# Patient Record
Sex: Male | Born: 1940 | Race: Black or African American | Hispanic: No | Marital: Married | Smoking: Former smoker
Health system: Southern US, Community
[De-identification: ages and names within clinical notes are randomized; demographics above are authoritative.]

---

## 2016-03-09 DIAGNOSIS — J449 Chronic obstructive pulmonary disease, unspecified: Secondary | ICD-10-CM | POA: Diagnosis not present

## 2016-04-08 DIAGNOSIS — J449 Chronic obstructive pulmonary disease, unspecified: Secondary | ICD-10-CM | POA: Diagnosis not present

## 2016-04-15 DIAGNOSIS — J449 Chronic obstructive pulmonary disease, unspecified: Secondary | ICD-10-CM | POA: Diagnosis not present

## 2016-04-17 DIAGNOSIS — I10 Essential (primary) hypertension: Secondary | ICD-10-CM | POA: Diagnosis not present

## 2016-05-09 DIAGNOSIS — J449 Chronic obstructive pulmonary disease, unspecified: Secondary | ICD-10-CM | POA: Diagnosis not present

## 2016-05-21 DIAGNOSIS — I10 Essential (primary) hypertension: Secondary | ICD-10-CM | POA: Diagnosis not present

## 2016-06-09 DIAGNOSIS — J449 Chronic obstructive pulmonary disease, unspecified: Secondary | ICD-10-CM | POA: Diagnosis not present

## 2016-07-09 DIAGNOSIS — J449 Chronic obstructive pulmonary disease, unspecified: Secondary | ICD-10-CM | POA: Diagnosis not present

## 2016-07-23 DIAGNOSIS — H353132 Nonexudative age-related macular degeneration, bilateral, intermediate dry stage: Secondary | ICD-10-CM | POA: Diagnosis not present

## 2016-08-09 DIAGNOSIS — J449 Chronic obstructive pulmonary disease, unspecified: Secondary | ICD-10-CM | POA: Diagnosis not present

## 2016-08-19 DIAGNOSIS — Z23 Encounter for immunization: Secondary | ICD-10-CM | POA: Diagnosis not present

## 2016-08-19 DIAGNOSIS — J449 Chronic obstructive pulmonary disease, unspecified: Secondary | ICD-10-CM | POA: Diagnosis not present

## 2016-08-21 DIAGNOSIS — J449 Chronic obstructive pulmonary disease, unspecified: Secondary | ICD-10-CM | POA: Diagnosis not present

## 2016-08-27 DIAGNOSIS — Z86010 Personal history of colonic polyps: Secondary | ICD-10-CM | POA: Diagnosis not present

## 2016-09-08 DIAGNOSIS — J449 Chronic obstructive pulmonary disease, unspecified: Secondary | ICD-10-CM | POA: Diagnosis not present

## 2016-10-02 DIAGNOSIS — Z23 Encounter for immunization: Secondary | ICD-10-CM | POA: Diagnosis not present

## 2016-10-02 DIAGNOSIS — R221 Localized swelling, mass and lump, neck: Secondary | ICD-10-CM | POA: Diagnosis not present

## 2016-10-02 DIAGNOSIS — Z13220 Encounter for screening for lipoid disorders: Secondary | ICD-10-CM | POA: Diagnosis not present

## 2016-10-02 DIAGNOSIS — I10 Essential (primary) hypertension: Secondary | ICD-10-CM | POA: Diagnosis not present

## 2016-10-09 DIAGNOSIS — J449 Chronic obstructive pulmonary disease, unspecified: Secondary | ICD-10-CM | POA: Diagnosis not present

## 2016-11-09 DIAGNOSIS — J449 Chronic obstructive pulmonary disease, unspecified: Secondary | ICD-10-CM | POA: Diagnosis not present

## 2016-11-25 DIAGNOSIS — J449 Chronic obstructive pulmonary disease, unspecified: Secondary | ICD-10-CM | POA: Diagnosis not present

## 2016-12-07 DIAGNOSIS — J449 Chronic obstructive pulmonary disease, unspecified: Secondary | ICD-10-CM | POA: Diagnosis not present

## 2017-01-07 DIAGNOSIS — J449 Chronic obstructive pulmonary disease, unspecified: Secondary | ICD-10-CM | POA: Diagnosis not present

## 2017-02-03 DIAGNOSIS — J449 Chronic obstructive pulmonary disease, unspecified: Secondary | ICD-10-CM | POA: Diagnosis not present

## 2017-02-06 DIAGNOSIS — J449 Chronic obstructive pulmonary disease, unspecified: Secondary | ICD-10-CM | POA: Diagnosis not present

## 2017-03-09 DIAGNOSIS — J449 Chronic obstructive pulmonary disease, unspecified: Secondary | ICD-10-CM | POA: Diagnosis not present

## 2017-04-08 DIAGNOSIS — J449 Chronic obstructive pulmonary disease, unspecified: Secondary | ICD-10-CM | POA: Diagnosis not present

## 2017-05-05 DIAGNOSIS — J449 Chronic obstructive pulmonary disease, unspecified: Secondary | ICD-10-CM | POA: Diagnosis not present

## 2017-05-09 DIAGNOSIS — J449 Chronic obstructive pulmonary disease, unspecified: Secondary | ICD-10-CM | POA: Diagnosis not present

## 2017-05-20 DIAGNOSIS — Z125 Encounter for screening for malignant neoplasm of prostate: Secondary | ICD-10-CM | POA: Diagnosis not present

## 2017-05-20 DIAGNOSIS — I10 Essential (primary) hypertension: Secondary | ICD-10-CM | POA: Diagnosis not present

## 2017-05-20 DIAGNOSIS — Z1389 Encounter for screening for other disorder: Secondary | ICD-10-CM | POA: Diagnosis not present

## 2017-05-20 DIAGNOSIS — R609 Edema, unspecified: Secondary | ICD-10-CM | POA: Diagnosis not present

## 2017-05-20 DIAGNOSIS — R05 Cough: Secondary | ICD-10-CM | POA: Diagnosis not present

## 2017-05-20 DIAGNOSIS — Z13220 Encounter for screening for lipoid disorders: Secondary | ICD-10-CM | POA: Diagnosis not present

## 2017-05-20 DIAGNOSIS — Z136 Encounter for screening for cardiovascular disorders: Secondary | ICD-10-CM | POA: Diagnosis not present

## 2017-05-20 DIAGNOSIS — R079 Chest pain, unspecified: Secondary | ICD-10-CM | POA: Diagnosis not present

## 2017-06-09 DIAGNOSIS — J449 Chronic obstructive pulmonary disease, unspecified: Secondary | ICD-10-CM | POA: Diagnosis not present

## 2017-07-09 DIAGNOSIS — J449 Chronic obstructive pulmonary disease, unspecified: Secondary | ICD-10-CM | POA: Diagnosis not present

## 2017-08-04 DIAGNOSIS — J449 Chronic obstructive pulmonary disease, unspecified: Secondary | ICD-10-CM | POA: Diagnosis not present

## 2017-08-07 DIAGNOSIS — H353132 Nonexudative age-related macular degeneration, bilateral, intermediate dry stage: Secondary | ICD-10-CM | POA: Diagnosis not present

## 2017-08-09 DIAGNOSIS — J449 Chronic obstructive pulmonary disease, unspecified: Secondary | ICD-10-CM | POA: Diagnosis not present

## 2017-09-08 DIAGNOSIS — J449 Chronic obstructive pulmonary disease, unspecified: Secondary | ICD-10-CM | POA: Diagnosis not present

## 2017-09-18 DIAGNOSIS — H47012 Ischemic optic neuropathy, left eye: Secondary | ICD-10-CM | POA: Diagnosis not present

## 2017-10-09 DIAGNOSIS — J449 Chronic obstructive pulmonary disease, unspecified: Secondary | ICD-10-CM | POA: Diagnosis not present

## 2017-11-09 DIAGNOSIS — J449 Chronic obstructive pulmonary disease, unspecified: Secondary | ICD-10-CM | POA: Diagnosis not present

## 2017-11-10 DIAGNOSIS — J449 Chronic obstructive pulmonary disease, unspecified: Secondary | ICD-10-CM | POA: Diagnosis not present

## 2017-11-17 DIAGNOSIS — Z9981 Dependence on supplemental oxygen: Secondary | ICD-10-CM | POA: Diagnosis not present

## 2017-11-17 DIAGNOSIS — Z452 Encounter for adjustment and management of vascular access device: Secondary | ICD-10-CM | POA: Diagnosis not present

## 2017-11-17 DIAGNOSIS — Z79899 Other long term (current) drug therapy: Secondary | ICD-10-CM | POA: Diagnosis not present

## 2017-11-17 DIAGNOSIS — Z7982 Long term (current) use of aspirin: Secondary | ICD-10-CM | POA: Diagnosis not present

## 2017-11-17 DIAGNOSIS — K3189 Other diseases of stomach and duodenum: Secondary | ICD-10-CM | POA: Diagnosis not present

## 2017-11-17 DIAGNOSIS — J9690 Respiratory failure, unspecified, unspecified whether with hypoxia or hypercapnia: Secondary | ICD-10-CM | POA: Diagnosis not present

## 2017-11-17 DIAGNOSIS — K921 Melena: Secondary | ICD-10-CM | POA: Diagnosis not present

## 2017-11-17 DIAGNOSIS — R197 Diarrhea, unspecified: Secondary | ICD-10-CM | POA: Diagnosis not present

## 2017-11-17 DIAGNOSIS — K259 Gastric ulcer, unspecified as acute or chronic, without hemorrhage or perforation: Secondary | ICD-10-CM | POA: Diagnosis not present

## 2017-11-17 DIAGNOSIS — J9621 Acute and chronic respiratory failure with hypoxia: Secondary | ICD-10-CM | POA: Diagnosis not present

## 2017-11-17 DIAGNOSIS — B370 Candidal stomatitis: Secondary | ICD-10-CM | POA: Diagnosis not present

## 2017-11-17 DIAGNOSIS — K222 Esophageal obstruction: Secondary | ICD-10-CM | POA: Diagnosis not present

## 2017-11-17 DIAGNOSIS — D509 Iron deficiency anemia, unspecified: Secondary | ICD-10-CM | POA: Diagnosis not present

## 2017-11-17 DIAGNOSIS — K648 Other hemorrhoids: Secondary | ICD-10-CM | POA: Diagnosis not present

## 2017-11-17 DIAGNOSIS — K2210 Ulcer of esophagus without bleeding: Secondary | ICD-10-CM | POA: Diagnosis not present

## 2017-11-17 DIAGNOSIS — K264 Chronic or unspecified duodenal ulcer with hemorrhage: Secondary | ICD-10-CM | POA: Diagnosis not present

## 2017-11-17 DIAGNOSIS — D126 Benign neoplasm of colon, unspecified: Secondary | ICD-10-CM | POA: Diagnosis not present

## 2017-11-17 DIAGNOSIS — Z87891 Personal history of nicotine dependence: Secondary | ICD-10-CM | POA: Diagnosis not present

## 2017-11-17 DIAGNOSIS — D122 Benign neoplasm of ascending colon: Secondary | ICD-10-CM | POA: Diagnosis not present

## 2017-11-17 DIAGNOSIS — J441 Chronic obstructive pulmonary disease with (acute) exacerbation: Secondary | ICD-10-CM | POA: Diagnosis not present

## 2017-11-17 DIAGNOSIS — K449 Diaphragmatic hernia without obstruction or gangrene: Secondary | ICD-10-CM | POA: Diagnosis not present

## 2017-11-17 DIAGNOSIS — K262 Acute duodenal ulcer with both hemorrhage and perforation: Secondary | ICD-10-CM | POA: Diagnosis not present

## 2017-11-17 DIAGNOSIS — J439 Emphysema, unspecified: Secondary | ICD-10-CM | POA: Diagnosis not present

## 2017-11-17 DIAGNOSIS — K5520 Angiodysplasia of colon without hemorrhage: Secondary | ICD-10-CM | POA: Diagnosis not present

## 2017-11-17 DIAGNOSIS — I10 Essential (primary) hypertension: Secondary | ICD-10-CM | POA: Diagnosis not present

## 2017-11-17 DIAGNOSIS — J449 Chronic obstructive pulmonary disease, unspecified: Secondary | ICD-10-CM | POA: Diagnosis not present

## 2017-11-17 DIAGNOSIS — R0602 Shortness of breath: Secondary | ICD-10-CM | POA: Diagnosis not present

## 2017-11-17 DIAGNOSIS — Z86010 Personal history of colonic polyps: Secondary | ICD-10-CM | POA: Diagnosis not present

## 2017-11-17 DIAGNOSIS — D649 Anemia, unspecified: Secondary | ICD-10-CM | POA: Diagnosis not present

## 2017-11-17 DIAGNOSIS — D62 Acute posthemorrhagic anemia: Secondary | ICD-10-CM | POA: Diagnosis not present

## 2017-11-17 DIAGNOSIS — K635 Polyp of colon: Secondary | ICD-10-CM | POA: Diagnosis not present

## 2017-11-17 DIAGNOSIS — Z9989 Dependence on other enabling machines and devices: Secondary | ICD-10-CM | POA: Diagnosis not present

## 2017-11-18 DIAGNOSIS — R0602 Shortness of breath: Secondary | ICD-10-CM | POA: Diagnosis not present

## 2017-12-03 DIAGNOSIS — H11002 Unspecified pterygium of left eye: Secondary | ICD-10-CM | POA: Diagnosis not present

## 2017-12-03 DIAGNOSIS — H401111 Primary open-angle glaucoma, right eye, mild stage: Secondary | ICD-10-CM | POA: Diagnosis not present

## 2017-12-03 DIAGNOSIS — H47012 Ischemic optic neuropathy, left eye: Secondary | ICD-10-CM | POA: Diagnosis not present

## 2017-12-03 DIAGNOSIS — H2512 Age-related nuclear cataract, left eye: Secondary | ICD-10-CM | POA: Diagnosis not present

## 2017-12-07 DIAGNOSIS — J449 Chronic obstructive pulmonary disease, unspecified: Secondary | ICD-10-CM | POA: Diagnosis not present

## 2018-01-07 DIAGNOSIS — J449 Chronic obstructive pulmonary disease, unspecified: Secondary | ICD-10-CM | POA: Diagnosis not present

## 2018-01-07 DIAGNOSIS — Z13220 Encounter for screening for lipoid disorders: Secondary | ICD-10-CM | POA: Diagnosis not present

## 2018-01-07 DIAGNOSIS — I10 Essential (primary) hypertension: Secondary | ICD-10-CM | POA: Diagnosis not present

## 2018-01-25 DIAGNOSIS — R0602 Shortness of breath: Secondary | ICD-10-CM | POA: Diagnosis not present

## 2018-01-25 DIAGNOSIS — J449 Chronic obstructive pulmonary disease, unspecified: Secondary | ICD-10-CM | POA: Diagnosis not present

## 2018-01-25 DIAGNOSIS — Z79899 Other long term (current) drug therapy: Secondary | ICD-10-CM | POA: Diagnosis not present

## 2018-01-25 DIAGNOSIS — M1990 Unspecified osteoarthritis, unspecified site: Secondary | ICD-10-CM | POA: Diagnosis not present

## 2018-01-25 DIAGNOSIS — I509 Heart failure, unspecified: Secondary | ICD-10-CM | POA: Diagnosis not present

## 2018-01-25 DIAGNOSIS — Z87891 Personal history of nicotine dependence: Secondary | ICD-10-CM | POA: Diagnosis not present

## 2018-01-25 DIAGNOSIS — R079 Chest pain, unspecified: Secondary | ICD-10-CM | POA: Diagnosis not present

## 2018-01-25 DIAGNOSIS — I110 Hypertensive heart disease with heart failure: Secondary | ICD-10-CM | POA: Diagnosis not present

## 2018-01-25 DIAGNOSIS — I5031 Acute diastolic (congestive) heart failure: Secondary | ICD-10-CM | POA: Diagnosis not present

## 2018-01-25 DIAGNOSIS — I5023 Acute on chronic systolic (congestive) heart failure: Secondary | ICD-10-CM | POA: Diagnosis not present

## 2018-01-25 DIAGNOSIS — Z9981 Dependence on supplemental oxygen: Secondary | ICD-10-CM | POA: Diagnosis not present

## 2018-01-25 DIAGNOSIS — I10 Essential (primary) hypertension: Secondary | ICD-10-CM | POA: Diagnosis not present

## 2018-01-25 DIAGNOSIS — J438 Other emphysema: Secondary | ICD-10-CM | POA: Diagnosis not present

## 2018-01-26 DIAGNOSIS — J449 Chronic obstructive pulmonary disease, unspecified: Secondary | ICD-10-CM | POA: Diagnosis not present

## 2018-01-26 DIAGNOSIS — I10 Essential (primary) hypertension: Secondary | ICD-10-CM | POA: Diagnosis not present

## 2018-01-26 DIAGNOSIS — Z9981 Dependence on supplemental oxygen: Secondary | ICD-10-CM | POA: Diagnosis not present

## 2018-01-26 DIAGNOSIS — I509 Heart failure, unspecified: Secondary | ICD-10-CM | POA: Diagnosis not present

## 2018-02-02 DIAGNOSIS — Z87891 Personal history of nicotine dependence: Secondary | ICD-10-CM | POA: Diagnosis not present

## 2018-02-02 DIAGNOSIS — Z79899 Other long term (current) drug therapy: Secondary | ICD-10-CM | POA: Diagnosis not present

## 2018-02-02 DIAGNOSIS — Z7952 Long term (current) use of systemic steroids: Secondary | ICD-10-CM | POA: Diagnosis not present

## 2018-02-02 DIAGNOSIS — R0602 Shortness of breath: Secondary | ICD-10-CM | POA: Diagnosis not present

## 2018-02-02 DIAGNOSIS — Z9981 Dependence on supplemental oxygen: Secondary | ICD-10-CM | POA: Diagnosis not present

## 2018-02-02 DIAGNOSIS — I110 Hypertensive heart disease with heart failure: Secondary | ICD-10-CM | POA: Diagnosis not present

## 2018-02-02 DIAGNOSIS — I509 Heart failure, unspecified: Secondary | ICD-10-CM | POA: Diagnosis not present

## 2018-02-02 DIAGNOSIS — R799 Abnormal finding of blood chemistry, unspecified: Secondary | ICD-10-CM | POA: Diagnosis not present

## 2018-02-02 DIAGNOSIS — E876 Hypokalemia: Secondary | ICD-10-CM | POA: Diagnosis not present

## 2018-02-02 DIAGNOSIS — J449 Chronic obstructive pulmonary disease, unspecified: Secondary | ICD-10-CM | POA: Diagnosis not present

## 2018-02-02 DIAGNOSIS — R0600 Dyspnea, unspecified: Secondary | ICD-10-CM | POA: Diagnosis not present

## 2018-02-06 DIAGNOSIS — J449 Chronic obstructive pulmonary disease, unspecified: Secondary | ICD-10-CM | POA: Diagnosis not present

## 2018-02-12 DIAGNOSIS — K921 Melena: Secondary | ICD-10-CM | POA: Diagnosis not present

## 2018-02-15 DIAGNOSIS — R509 Fever, unspecified: Secondary | ICD-10-CM | POA: Diagnosis not present

## 2018-02-15 DIAGNOSIS — E875 Hyperkalemia: Secondary | ICD-10-CM | POA: Diagnosis not present

## 2018-02-15 DIAGNOSIS — I110 Hypertensive heart disease with heart failure: Secondary | ICD-10-CM | POA: Diagnosis not present

## 2018-02-15 DIAGNOSIS — I248 Other forms of acute ischemic heart disease: Secondary | ICD-10-CM | POA: Diagnosis not present

## 2018-02-15 DIAGNOSIS — R400 Somnolence: Secondary | ICD-10-CM | POA: Diagnosis not present

## 2018-02-15 DIAGNOSIS — R0682 Tachypnea, not elsewhere classified: Secondary | ICD-10-CM | POA: Diagnosis not present

## 2018-02-15 DIAGNOSIS — R7989 Other specified abnormal findings of blood chemistry: Secondary | ICD-10-CM | POA: Diagnosis not present

## 2018-02-15 DIAGNOSIS — R0602 Shortness of breath: Secondary | ICD-10-CM | POA: Diagnosis not present

## 2018-02-15 DIAGNOSIS — J441 Chronic obstructive pulmonary disease with (acute) exacerbation: Secondary | ICD-10-CM | POA: Diagnosis not present

## 2018-02-15 DIAGNOSIS — R5383 Other fatigue: Secondary | ICD-10-CM | POA: Diagnosis not present

## 2018-02-15 DIAGNOSIS — J9602 Acute respiratory failure with hypercapnia: Secondary | ICD-10-CM | POA: Diagnosis not present

## 2018-02-15 DIAGNOSIS — K279 Peptic ulcer, site unspecified, unspecified as acute or chronic, without hemorrhage or perforation: Secondary | ICD-10-CM | POA: Diagnosis not present

## 2018-02-15 DIAGNOSIS — Z8719 Personal history of other diseases of the digestive system: Secondary | ICD-10-CM | POA: Diagnosis not present

## 2018-02-15 DIAGNOSIS — G9341 Metabolic encephalopathy: Secondary | ICD-10-CM | POA: Diagnosis not present

## 2018-02-15 DIAGNOSIS — R079 Chest pain, unspecified: Secondary | ICD-10-CM | POA: Diagnosis not present

## 2018-02-15 DIAGNOSIS — Z87891 Personal history of nicotine dependence: Secondary | ICD-10-CM | POA: Diagnosis not present

## 2018-02-15 DIAGNOSIS — I5032 Chronic diastolic (congestive) heart failure: Secondary | ICD-10-CM | POA: Diagnosis not present

## 2018-02-15 DIAGNOSIS — Z79899 Other long term (current) drug therapy: Secondary | ICD-10-CM | POA: Diagnosis not present

## 2018-02-16 DIAGNOSIS — I5032 Chronic diastolic (congestive) heart failure: Secondary | ICD-10-CM | POA: Diagnosis not present

## 2018-02-16 DIAGNOSIS — J9602 Acute respiratory failure with hypercapnia: Secondary | ICD-10-CM | POA: Diagnosis not present

## 2018-02-16 DIAGNOSIS — I110 Hypertensive heart disease with heart failure: Secondary | ICD-10-CM | POA: Diagnosis not present

## 2018-02-16 DIAGNOSIS — E875 Hyperkalemia: Secondary | ICD-10-CM | POA: Diagnosis not present

## 2018-02-16 DIAGNOSIS — G9341 Metabolic encephalopathy: Secondary | ICD-10-CM | POA: Diagnosis not present

## 2018-02-16 DIAGNOSIS — R400 Somnolence: Secondary | ICD-10-CM | POA: Diagnosis not present

## 2018-02-16 DIAGNOSIS — J441 Chronic obstructive pulmonary disease with (acute) exacerbation: Secondary | ICD-10-CM | POA: Diagnosis not present

## 2018-02-24 DIAGNOSIS — I10 Essential (primary) hypertension: Secondary | ICD-10-CM | POA: Diagnosis not present

## 2018-02-24 DIAGNOSIS — J449 Chronic obstructive pulmonary disease, unspecified: Secondary | ICD-10-CM | POA: Diagnosis not present

## 2018-02-24 DIAGNOSIS — E875 Hyperkalemia: Secondary | ICD-10-CM | POA: Diagnosis not present

## 2018-02-27 DIAGNOSIS — J9612 Chronic respiratory failure with hypercapnia: Secondary | ICD-10-CM | POA: Diagnosis not present

## 2018-02-27 DIAGNOSIS — J449 Chronic obstructive pulmonary disease, unspecified: Secondary | ICD-10-CM | POA: Diagnosis not present

## 2018-03-02 DIAGNOSIS — D519 Vitamin B12 deficiency anemia, unspecified: Secondary | ICD-10-CM | POA: Diagnosis not present

## 2018-03-02 DIAGNOSIS — I10 Essential (primary) hypertension: Secondary | ICD-10-CM | POA: Diagnosis not present

## 2018-03-02 DIAGNOSIS — R221 Localized swelling, mass and lump, neck: Secondary | ICD-10-CM | POA: Diagnosis not present

## 2018-03-02 DIAGNOSIS — E875 Hyperkalemia: Secondary | ICD-10-CM | POA: Diagnosis not present

## 2018-03-02 DIAGNOSIS — R799 Abnormal finding of blood chemistry, unspecified: Secondary | ICD-10-CM | POA: Diagnosis not present

## 2018-03-02 DIAGNOSIS — E785 Hyperlipidemia, unspecified: Secondary | ICD-10-CM | POA: Diagnosis not present

## 2018-03-02 DIAGNOSIS — D72829 Elevated white blood cell count, unspecified: Secondary | ICD-10-CM | POA: Diagnosis not present

## 2018-03-02 DIAGNOSIS — R7309 Other abnormal glucose: Secondary | ICD-10-CM | POA: Diagnosis not present

## 2018-03-02 DIAGNOSIS — R609 Edema, unspecified: Secondary | ICD-10-CM | POA: Diagnosis not present

## 2018-03-02 DIAGNOSIS — D509 Iron deficiency anemia, unspecified: Secondary | ICD-10-CM | POA: Diagnosis not present

## 2018-03-05 DIAGNOSIS — J449 Chronic obstructive pulmonary disease, unspecified: Secondary | ICD-10-CM | POA: Diagnosis not present

## 2018-03-05 DIAGNOSIS — D72829 Elevated white blood cell count, unspecified: Secondary | ICD-10-CM | POA: Diagnosis not present

## 2018-03-08 DIAGNOSIS — Z7952 Long term (current) use of systemic steroids: Secondary | ICD-10-CM | POA: Diagnosis not present

## 2018-03-08 DIAGNOSIS — Z79899 Other long term (current) drug therapy: Secondary | ICD-10-CM | POA: Diagnosis not present

## 2018-03-08 DIAGNOSIS — I110 Hypertensive heart disease with heart failure: Secondary | ICD-10-CM | POA: Diagnosis not present

## 2018-03-08 DIAGNOSIS — R0602 Shortness of breath: Secondary | ICD-10-CM | POA: Diagnosis not present

## 2018-03-08 DIAGNOSIS — Z9981 Dependence on supplemental oxygen: Secondary | ICD-10-CM | POA: Diagnosis not present

## 2018-03-08 DIAGNOSIS — J441 Chronic obstructive pulmonary disease with (acute) exacerbation: Secondary | ICD-10-CM | POA: Diagnosis not present

## 2018-03-08 DIAGNOSIS — Z792 Long term (current) use of antibiotics: Secondary | ICD-10-CM | POA: Diagnosis not present

## 2018-03-08 DIAGNOSIS — I509 Heart failure, unspecified: Secondary | ICD-10-CM | POA: Diagnosis not present

## 2018-03-09 DIAGNOSIS — J449 Chronic obstructive pulmonary disease, unspecified: Secondary | ICD-10-CM | POA: Diagnosis not present

## 2018-03-11 DIAGNOSIS — R0602 Shortness of breath: Secondary | ICD-10-CM | POA: Diagnosis not present

## 2018-03-11 DIAGNOSIS — Z9981 Dependence on supplemental oxygen: Secondary | ICD-10-CM

## 2018-03-11 DIAGNOSIS — A419 Sepsis, unspecified organism: Secondary | ICD-10-CM | POA: Diagnosis not present

## 2018-03-11 DIAGNOSIS — J9620 Acute and chronic respiratory failure, unspecified whether with hypoxia or hypercapnia: Secondary | ICD-10-CM | POA: Diagnosis not present

## 2018-03-11 DIAGNOSIS — Z79899 Other long term (current) drug therapy: Secondary | ICD-10-CM | POA: Diagnosis not present

## 2018-03-11 DIAGNOSIS — I5030 Unspecified diastolic (congestive) heart failure: Secondary | ICD-10-CM

## 2018-03-11 DIAGNOSIS — R400 Somnolence: Secondary | ICD-10-CM | POA: Diagnosis not present

## 2018-03-11 DIAGNOSIS — R404 Transient alteration of awareness: Secondary | ICD-10-CM | POA: Diagnosis not present

## 2018-03-11 DIAGNOSIS — R4020 Unspecified coma: Secondary | ICD-10-CM | POA: Diagnosis not present

## 2018-03-11 DIAGNOSIS — J9690 Respiratory failure, unspecified, unspecified whether with hypoxia or hypercapnia: Secondary | ICD-10-CM | POA: Diagnosis not present

## 2018-03-11 DIAGNOSIS — R0902 Hypoxemia: Secondary | ICD-10-CM | POA: Diagnosis not present

## 2018-03-11 DIAGNOSIS — K269 Duodenal ulcer, unspecified as acute or chronic, without hemorrhage or perforation: Secondary | ICD-10-CM

## 2018-03-11 DIAGNOSIS — R402441 Other coma, without documented Glasgow coma scale score, or with partial score reported, in the field [EMT or ambulance]: Secondary | ICD-10-CM | POA: Diagnosis not present

## 2018-03-11 DIAGNOSIS — R5381 Other malaise: Secondary | ICD-10-CM | POA: Diagnosis not present

## 2018-03-11 DIAGNOSIS — R4182 Altered mental status, unspecified: Secondary | ICD-10-CM

## 2018-03-11 DIAGNOSIS — J440 Chronic obstructive pulmonary disease with acute lower respiratory infection: Secondary | ICD-10-CM | POA: Diagnosis not present

## 2018-03-11 DIAGNOSIS — J449 Chronic obstructive pulmonary disease, unspecified: Secondary | ICD-10-CM | POA: Diagnosis not present

## 2018-03-11 DIAGNOSIS — J9622 Acute and chronic respiratory failure with hypercapnia: Secondary | ICD-10-CM | POA: Diagnosis not present

## 2018-03-11 DIAGNOSIS — J181 Lobar pneumonia, unspecified organism: Secondary | ICD-10-CM | POA: Diagnosis not present

## 2018-03-11 DIAGNOSIS — I5033 Acute on chronic diastolic (congestive) heart failure: Secondary | ICD-10-CM | POA: Diagnosis not present

## 2018-03-11 DIAGNOSIS — I4510 Unspecified right bundle-branch block: Secondary | ICD-10-CM | POA: Diagnosis not present

## 2018-03-11 DIAGNOSIS — I110 Hypertensive heart disease with heart failure: Secondary | ICD-10-CM | POA: Diagnosis not present

## 2018-03-11 DIAGNOSIS — J9621 Acute and chronic respiratory failure with hypoxia: Secondary | ICD-10-CM | POA: Diagnosis not present

## 2018-03-11 DIAGNOSIS — J441 Chronic obstructive pulmonary disease with (acute) exacerbation: Secondary | ICD-10-CM

## 2018-03-11 DIAGNOSIS — R0689 Other abnormalities of breathing: Secondary | ICD-10-CM | POA: Diagnosis not present

## 2018-03-11 DIAGNOSIS — Z85038 Personal history of other malignant neoplasm of large intestine: Secondary | ICD-10-CM | POA: Diagnosis not present

## 2018-03-11 DIAGNOSIS — J189 Pneumonia, unspecified organism: Secondary | ICD-10-CM

## 2018-03-11 DIAGNOSIS — Z9989 Dependence on other enabling machines and devices: Secondary | ICD-10-CM | POA: Diagnosis not present

## 2018-03-11 DIAGNOSIS — Z7952 Long term (current) use of systemic steroids: Secondary | ICD-10-CM | POA: Diagnosis not present

## 2018-03-17 DIAGNOSIS — J449 Chronic obstructive pulmonary disease, unspecified: Secondary | ICD-10-CM | POA: Diagnosis not present

## 2018-03-31 DIAGNOSIS — I10 Essential (primary) hypertension: Secondary | ICD-10-CM | POA: Diagnosis not present

## 2018-03-31 DIAGNOSIS — J449 Chronic obstructive pulmonary disease, unspecified: Secondary | ICD-10-CM | POA: Diagnosis not present

## 2018-03-31 DIAGNOSIS — R609 Edema, unspecified: Secondary | ICD-10-CM | POA: Diagnosis not present

## 2018-04-06 DIAGNOSIS — D72829 Elevated white blood cell count, unspecified: Secondary | ICD-10-CM | POA: Diagnosis not present

## 2018-04-06 DIAGNOSIS — R799 Abnormal finding of blood chemistry, unspecified: Secondary | ICD-10-CM | POA: Diagnosis not present

## 2018-04-10 DIAGNOSIS — S20219A Contusion of unspecified front wall of thorax, initial encounter: Secondary | ICD-10-CM | POA: Diagnosis not present

## 2018-04-10 DIAGNOSIS — S2232XA Fracture of one rib, left side, initial encounter for closed fracture: Secondary | ICD-10-CM | POA: Diagnosis not present

## 2018-04-15 DIAGNOSIS — D649 Anemia, unspecified: Secondary | ICD-10-CM | POA: Diagnosis not present

## 2018-04-15 DIAGNOSIS — I959 Hypotension, unspecified: Secondary | ICD-10-CM | POA: Diagnosis not present

## 2018-04-16 DIAGNOSIS — J449 Chronic obstructive pulmonary disease, unspecified: Secondary | ICD-10-CM | POA: Diagnosis not present

## 2018-04-20 DIAGNOSIS — R0600 Dyspnea, unspecified: Secondary | ICD-10-CM | POA: Diagnosis not present

## 2018-04-20 DIAGNOSIS — G4733 Obstructive sleep apnea (adult) (pediatric): Secondary | ICD-10-CM | POA: Diagnosis not present

## 2018-04-20 DIAGNOSIS — J449 Chronic obstructive pulmonary disease, unspecified: Secondary | ICD-10-CM | POA: Diagnosis not present

## 2018-04-22 ENCOUNTER — Institutional Professional Consult (permissible substitution): Payer: Medicare Other | Admitting: Emergency Medicine

## 2018-04-26 DIAGNOSIS — J449 Chronic obstructive pulmonary disease, unspecified: Secondary | ICD-10-CM | POA: Diagnosis not present

## 2018-04-26 DIAGNOSIS — R0902 Hypoxemia: Secondary | ICD-10-CM | POA: Diagnosis not present

## 2018-05-17 DIAGNOSIS — J449 Chronic obstructive pulmonary disease, unspecified: Secondary | ICD-10-CM | POA: Diagnosis not present

## 2018-05-27 DIAGNOSIS — D649 Anemia, unspecified: Secondary | ICD-10-CM | POA: Diagnosis not present

## 2018-05-27 DIAGNOSIS — I959 Hypotension, unspecified: Secondary | ICD-10-CM | POA: Diagnosis not present

## 2018-06-08 DIAGNOSIS — J449 Chronic obstructive pulmonary disease, unspecified: Secondary | ICD-10-CM | POA: Diagnosis not present

## 2018-06-08 DIAGNOSIS — R0600 Dyspnea, unspecified: Secondary | ICD-10-CM | POA: Diagnosis not present

## 2018-06-08 DIAGNOSIS — G4733 Obstructive sleep apnea (adult) (pediatric): Secondary | ICD-10-CM | POA: Diagnosis not present

## 2018-06-17 DIAGNOSIS — J449 Chronic obstructive pulmonary disease, unspecified: Secondary | ICD-10-CM | POA: Diagnosis not present

## 2018-06-17 DIAGNOSIS — H401111 Primary open-angle glaucoma, right eye, mild stage: Secondary | ICD-10-CM | POA: Diagnosis not present

## 2018-06-22 DIAGNOSIS — R0602 Shortness of breath: Secondary | ICD-10-CM | POA: Diagnosis not present

## 2018-07-03 DIAGNOSIS — G4733 Obstructive sleep apnea (adult) (pediatric): Secondary | ICD-10-CM | POA: Diagnosis not present

## 2018-07-06 DIAGNOSIS — R05 Cough: Secondary | ICD-10-CM | POA: Diagnosis not present

## 2018-07-06 DIAGNOSIS — G4733 Obstructive sleep apnea (adult) (pediatric): Secondary | ICD-10-CM | POA: Diagnosis not present

## 2018-07-06 DIAGNOSIS — J449 Chronic obstructive pulmonary disease, unspecified: Secondary | ICD-10-CM | POA: Diagnosis not present

## 2018-07-06 DIAGNOSIS — G4761 Periodic limb movement disorder: Secondary | ICD-10-CM | POA: Diagnosis not present

## 2018-07-06 DIAGNOSIS — Z23 Encounter for immunization: Secondary | ICD-10-CM | POA: Diagnosis not present

## 2018-07-17 DIAGNOSIS — J449 Chronic obstructive pulmonary disease, unspecified: Secondary | ICD-10-CM | POA: Diagnosis not present

## 2018-08-16 DIAGNOSIS — J441 Chronic obstructive pulmonary disease with (acute) exacerbation: Secondary | ICD-10-CM | POA: Diagnosis not present

## 2018-08-16 DIAGNOSIS — I5033 Acute on chronic diastolic (congestive) heart failure: Secondary | ICD-10-CM | POA: Diagnosis not present

## 2018-08-16 DIAGNOSIS — R05 Cough: Secondary | ICD-10-CM | POA: Diagnosis not present

## 2018-08-16 DIAGNOSIS — J9621 Acute and chronic respiratory failure with hypoxia: Secondary | ICD-10-CM | POA: Diagnosis not present

## 2018-08-16 DIAGNOSIS — I110 Hypertensive heart disease with heart failure: Secondary | ICD-10-CM | POA: Diagnosis not present

## 2018-08-16 DIAGNOSIS — Z7982 Long term (current) use of aspirin: Secondary | ICD-10-CM | POA: Diagnosis not present

## 2018-08-16 DIAGNOSIS — J440 Chronic obstructive pulmonary disease with acute lower respiratory infection: Secondary | ICD-10-CM | POA: Diagnosis not present

## 2018-08-16 DIAGNOSIS — Z9981 Dependence on supplemental oxygen: Secondary | ICD-10-CM | POA: Diagnosis not present

## 2018-08-16 DIAGNOSIS — Z7952 Long term (current) use of systemic steroids: Secondary | ICD-10-CM | POA: Diagnosis not present

## 2018-08-16 DIAGNOSIS — I5030 Unspecified diastolic (congestive) heart failure: Secondary | ICD-10-CM | POA: Diagnosis not present

## 2018-08-16 DIAGNOSIS — J189 Pneumonia, unspecified organism: Secondary | ICD-10-CM | POA: Diagnosis not present

## 2018-08-16 DIAGNOSIS — I10 Essential (primary) hypertension: Secondary | ICD-10-CM | POA: Diagnosis not present

## 2018-08-16 DIAGNOSIS — G4733 Obstructive sleep apnea (adult) (pediatric): Secondary | ICD-10-CM | POA: Diagnosis not present

## 2018-08-16 DIAGNOSIS — Z79899 Other long term (current) drug therapy: Secondary | ICD-10-CM | POA: Diagnosis not present

## 2018-08-16 DIAGNOSIS — R0602 Shortness of breath: Secondary | ICD-10-CM | POA: Diagnosis not present

## 2018-08-16 DIAGNOSIS — Z87891 Personal history of nicotine dependence: Secondary | ICD-10-CM | POA: Diagnosis not present

## 2018-08-17 DIAGNOSIS — G4733 Obstructive sleep apnea (adult) (pediatric): Secondary | ICD-10-CM | POA: Diagnosis not present

## 2018-08-17 DIAGNOSIS — J441 Chronic obstructive pulmonary disease with (acute) exacerbation: Secondary | ICD-10-CM | POA: Diagnosis not present

## 2018-08-17 DIAGNOSIS — J9621 Acute and chronic respiratory failure with hypoxia: Secondary | ICD-10-CM | POA: Diagnosis not present

## 2018-08-17 DIAGNOSIS — I5030 Unspecified diastolic (congestive) heart failure: Secondary | ICD-10-CM | POA: Diagnosis not present

## 2018-08-17 DIAGNOSIS — I10 Essential (primary) hypertension: Secondary | ICD-10-CM | POA: Diagnosis not present

## 2018-08-17 DIAGNOSIS — J449 Chronic obstructive pulmonary disease, unspecified: Secondary | ICD-10-CM | POA: Diagnosis not present

## 2018-08-18 DIAGNOSIS — I5030 Unspecified diastolic (congestive) heart failure: Secondary | ICD-10-CM | POA: Diagnosis not present

## 2018-08-18 DIAGNOSIS — I10 Essential (primary) hypertension: Secondary | ICD-10-CM | POA: Diagnosis not present

## 2018-08-18 DIAGNOSIS — G4733 Obstructive sleep apnea (adult) (pediatric): Secondary | ICD-10-CM | POA: Diagnosis not present

## 2018-08-18 DIAGNOSIS — J9621 Acute and chronic respiratory failure with hypoxia: Secondary | ICD-10-CM | POA: Diagnosis not present

## 2018-08-18 DIAGNOSIS — J441 Chronic obstructive pulmonary disease with (acute) exacerbation: Secondary | ICD-10-CM | POA: Diagnosis not present

## 2018-09-16 DIAGNOSIS — J449 Chronic obstructive pulmonary disease, unspecified: Secondary | ICD-10-CM | POA: Diagnosis not present

## 2018-10-16 DIAGNOSIS — G4733 Obstructive sleep apnea (adult) (pediatric): Secondary | ICD-10-CM | POA: Diagnosis not present

## 2018-10-17 DIAGNOSIS — J449 Chronic obstructive pulmonary disease, unspecified: Secondary | ICD-10-CM | POA: Diagnosis not present

## 2018-10-19 DIAGNOSIS — J301 Allergic rhinitis due to pollen: Secondary | ICD-10-CM | POA: Diagnosis not present

## 2018-10-19 DIAGNOSIS — R5383 Other fatigue: Secondary | ICD-10-CM | POA: Diagnosis not present

## 2018-10-19 DIAGNOSIS — J4550 Severe persistent asthma, uncomplicated: Secondary | ICD-10-CM | POA: Diagnosis not present

## 2018-10-30 DIAGNOSIS — I10 Essential (primary) hypertension: Secondary | ICD-10-CM | POA: Diagnosis not present

## 2018-10-30 DIAGNOSIS — J449 Chronic obstructive pulmonary disease, unspecified: Secondary | ICD-10-CM | POA: Diagnosis not present

## 2018-10-30 DIAGNOSIS — Z13220 Encounter for screening for lipoid disorders: Secondary | ICD-10-CM | POA: Diagnosis not present

## 2018-11-17 DIAGNOSIS — J449 Chronic obstructive pulmonary disease, unspecified: Secondary | ICD-10-CM | POA: Diagnosis not present

## 2018-11-24 DIAGNOSIS — H401112 Primary open-angle glaucoma, right eye, moderate stage: Secondary | ICD-10-CM | POA: Diagnosis not present

## 2018-12-04 DIAGNOSIS — J449 Chronic obstructive pulmonary disease, unspecified: Secondary | ICD-10-CM | POA: Diagnosis not present

## 2018-12-07 DIAGNOSIS — E559 Vitamin D deficiency, unspecified: Secondary | ICD-10-CM | POA: Diagnosis not present

## 2018-12-16 DIAGNOSIS — J449 Chronic obstructive pulmonary disease, unspecified: Secondary | ICD-10-CM | POA: Diagnosis not present

## 2018-12-24 DIAGNOSIS — J449 Chronic obstructive pulmonary disease, unspecified: Secondary | ICD-10-CM | POA: Diagnosis not present

## 2019-01-04 DIAGNOSIS — J449 Chronic obstructive pulmonary disease, unspecified: Secondary | ICD-10-CM | POA: Diagnosis not present

## 2019-01-16 DIAGNOSIS — J449 Chronic obstructive pulmonary disease, unspecified: Secondary | ICD-10-CM | POA: Diagnosis not present

## 2019-01-25 DIAGNOSIS — J449 Chronic obstructive pulmonary disease, unspecified: Secondary | ICD-10-CM | POA: Diagnosis not present

## 2019-01-25 DIAGNOSIS — G4733 Obstructive sleep apnea (adult) (pediatric): Secondary | ICD-10-CM | POA: Diagnosis not present

## 2019-01-25 DIAGNOSIS — R0600 Dyspnea, unspecified: Secondary | ICD-10-CM | POA: Diagnosis not present

## 2019-01-25 DIAGNOSIS — J9612 Chronic respiratory failure with hypercapnia: Secondary | ICD-10-CM | POA: Diagnosis not present

## 2019-01-25 DIAGNOSIS — G4761 Periodic limb movement disorder: Secondary | ICD-10-CM | POA: Diagnosis not present

## 2019-01-28 DIAGNOSIS — J449 Chronic obstructive pulmonary disease, unspecified: Secondary | ICD-10-CM | POA: Diagnosis not present

## 2019-01-28 DIAGNOSIS — I10 Essential (primary) hypertension: Secondary | ICD-10-CM | POA: Diagnosis not present

## 2019-01-28 DIAGNOSIS — E785 Hyperlipidemia, unspecified: Secondary | ICD-10-CM | POA: Diagnosis not present

## 2019-01-28 DIAGNOSIS — R7309 Other abnormal glucose: Secondary | ICD-10-CM | POA: Diagnosis not present

## 2019-02-01 DIAGNOSIS — R609 Edema, unspecified: Secondary | ICD-10-CM | POA: Diagnosis not present

## 2019-02-01 DIAGNOSIS — E559 Vitamin D deficiency, unspecified: Secondary | ICD-10-CM | POA: Diagnosis not present

## 2019-02-01 DIAGNOSIS — D519 Vitamin B12 deficiency anemia, unspecified: Secondary | ICD-10-CM | POA: Diagnosis not present

## 2019-02-01 DIAGNOSIS — W1830XA Fall on same level, unspecified, initial encounter: Secondary | ICD-10-CM | POA: Diagnosis not present

## 2019-02-01 DIAGNOSIS — R202 Paresthesia of skin: Secondary | ICD-10-CM | POA: Diagnosis not present

## 2019-02-01 DIAGNOSIS — R600 Localized edema: Secondary | ICD-10-CM | POA: Diagnosis not present

## 2019-02-03 DIAGNOSIS — J449 Chronic obstructive pulmonary disease, unspecified: Secondary | ICD-10-CM | POA: Diagnosis not present

## 2019-02-15 DIAGNOSIS — J449 Chronic obstructive pulmonary disease, unspecified: Secondary | ICD-10-CM | POA: Diagnosis not present

## 2019-02-26 DIAGNOSIS — H26491 Other secondary cataract, right eye: Secondary | ICD-10-CM | POA: Diagnosis not present

## 2019-03-01 DIAGNOSIS — H6982 Other specified disorders of Eustachian tube, left ear: Secondary | ICD-10-CM | POA: Diagnosis not present

## 2019-03-01 DIAGNOSIS — H6123 Impacted cerumen, bilateral: Secondary | ICD-10-CM | POA: Diagnosis not present

## 2019-03-01 DIAGNOSIS — Z8673 Personal history of transient ischemic attack (TIA), and cerebral infarction without residual deficits: Secondary | ICD-10-CM | POA: Diagnosis not present

## 2019-03-01 DIAGNOSIS — H9190 Unspecified hearing loss, unspecified ear: Secondary | ICD-10-CM | POA: Diagnosis not present

## 2019-03-01 DIAGNOSIS — H938X3 Other specified disorders of ear, bilateral: Secondary | ICD-10-CM | POA: Diagnosis not present

## 2019-03-06 DIAGNOSIS — J449 Chronic obstructive pulmonary disease, unspecified: Secondary | ICD-10-CM | POA: Diagnosis not present

## 2019-03-08 DIAGNOSIS — R202 Paresthesia of skin: Secondary | ICD-10-CM | POA: Diagnosis not present

## 2019-03-08 DIAGNOSIS — M79672 Pain in left foot: Secondary | ICD-10-CM | POA: Diagnosis not present

## 2019-03-18 DIAGNOSIS — J449 Chronic obstructive pulmonary disease, unspecified: Secondary | ICD-10-CM | POA: Diagnosis not present

## 2019-03-22 DIAGNOSIS — Z9981 Dependence on supplemental oxygen: Secondary | ICD-10-CM | POA: Diagnosis not present

## 2019-03-22 DIAGNOSIS — Z8669 Personal history of other diseases of the nervous system and sense organs: Secondary | ICD-10-CM | POA: Diagnosis not present

## 2019-03-22 DIAGNOSIS — J449 Chronic obstructive pulmonary disease, unspecified: Secondary | ICD-10-CM | POA: Diagnosis not present

## 2019-04-05 DIAGNOSIS — J449 Chronic obstructive pulmonary disease, unspecified: Secondary | ICD-10-CM | POA: Diagnosis not present

## 2019-04-17 DIAGNOSIS — J449 Chronic obstructive pulmonary disease, unspecified: Secondary | ICD-10-CM | POA: Diagnosis not present

## 2019-05-06 DIAGNOSIS — J449 Chronic obstructive pulmonary disease, unspecified: Secondary | ICD-10-CM | POA: Diagnosis not present

## 2019-05-18 DIAGNOSIS — J449 Chronic obstructive pulmonary disease, unspecified: Secondary | ICD-10-CM | POA: Diagnosis not present

## 2019-05-28 DIAGNOSIS — H401123 Primary open-angle glaucoma, left eye, severe stage: Secondary | ICD-10-CM | POA: Diagnosis not present

## 2019-05-31 DIAGNOSIS — J9612 Chronic respiratory failure with hypercapnia: Secondary | ICD-10-CM | POA: Diagnosis not present

## 2019-05-31 DIAGNOSIS — J449 Chronic obstructive pulmonary disease, unspecified: Secondary | ICD-10-CM | POA: Diagnosis not present

## 2019-05-31 DIAGNOSIS — G4733 Obstructive sleep apnea (adult) (pediatric): Secondary | ICD-10-CM | POA: Diagnosis not present

## 2019-05-31 DIAGNOSIS — R0600 Dyspnea, unspecified: Secondary | ICD-10-CM | POA: Diagnosis not present

## 2019-06-06 DIAGNOSIS — J449 Chronic obstructive pulmonary disease, unspecified: Secondary | ICD-10-CM | POA: Diagnosis not present

## 2019-06-18 DIAGNOSIS — J449 Chronic obstructive pulmonary disease, unspecified: Secondary | ICD-10-CM | POA: Diagnosis not present

## 2019-07-06 DIAGNOSIS — J449 Chronic obstructive pulmonary disease, unspecified: Secondary | ICD-10-CM | POA: Diagnosis not present

## 2019-07-18 DIAGNOSIS — J449 Chronic obstructive pulmonary disease, unspecified: Secondary | ICD-10-CM | POA: Diagnosis not present

## 2019-08-06 DIAGNOSIS — J449 Chronic obstructive pulmonary disease, unspecified: Secondary | ICD-10-CM | POA: Diagnosis not present

## 2019-08-18 DIAGNOSIS — J449 Chronic obstructive pulmonary disease, unspecified: Secondary | ICD-10-CM | POA: Diagnosis not present

## 2019-08-19 DIAGNOSIS — Z23 Encounter for immunization: Secondary | ICD-10-CM | POA: Diagnosis not present

## 2019-08-30 DIAGNOSIS — Z8719 Personal history of other diseases of the digestive system: Secondary | ICD-10-CM | POA: Diagnosis not present

## 2019-08-30 DIAGNOSIS — R918 Other nonspecific abnormal finding of lung field: Secondary | ICD-10-CM | POA: Diagnosis not present

## 2019-08-30 DIAGNOSIS — Z7952 Long term (current) use of systemic steroids: Secondary | ICD-10-CM | POA: Diagnosis not present

## 2019-08-30 DIAGNOSIS — D509 Iron deficiency anemia, unspecified: Secondary | ICD-10-CM | POA: Diagnosis not present

## 2019-08-30 DIAGNOSIS — A419 Sepsis, unspecified organism: Secondary | ICD-10-CM | POA: Diagnosis not present

## 2019-08-30 DIAGNOSIS — E875 Hyperkalemia: Secondary | ICD-10-CM | POA: Diagnosis not present

## 2019-08-30 DIAGNOSIS — G9341 Metabolic encephalopathy: Secondary | ICD-10-CM | POA: Diagnosis not present

## 2019-08-30 DIAGNOSIS — I5023 Acute on chronic systolic (congestive) heart failure: Secondary | ICD-10-CM | POA: Diagnosis not present

## 2019-08-30 DIAGNOSIS — Z7982 Long term (current) use of aspirin: Secondary | ICD-10-CM | POA: Diagnosis not present

## 2019-08-30 DIAGNOSIS — E785 Hyperlipidemia, unspecified: Secondary | ICD-10-CM | POA: Diagnosis not present

## 2019-08-30 DIAGNOSIS — Z743 Need for continuous supervision: Secondary | ICD-10-CM | POA: Diagnosis not present

## 2019-08-30 DIAGNOSIS — R0602 Shortness of breath: Secondary | ICD-10-CM | POA: Diagnosis not present

## 2019-08-30 DIAGNOSIS — I491 Atrial premature depolarization: Secondary | ICD-10-CM | POA: Diagnosis not present

## 2019-08-30 DIAGNOSIS — R195 Other fecal abnormalities: Secondary | ICD-10-CM | POA: Diagnosis not present

## 2019-08-30 DIAGNOSIS — J189 Pneumonia, unspecified organism: Secondary | ICD-10-CM | POA: Diagnosis not present

## 2019-08-30 DIAGNOSIS — D649 Anemia, unspecified: Secondary | ICD-10-CM | POA: Diagnosis not present

## 2019-08-30 DIAGNOSIS — M1990 Unspecified osteoarthritis, unspecified site: Secondary | ICD-10-CM | POA: Diagnosis not present

## 2019-08-30 DIAGNOSIS — I5030 Unspecified diastolic (congestive) heart failure: Secondary | ICD-10-CM

## 2019-08-30 DIAGNOSIS — I110 Hypertensive heart disease with heart failure: Secondary | ICD-10-CM | POA: Diagnosis not present

## 2019-08-30 DIAGNOSIS — R0600 Dyspnea, unspecified: Secondary | ICD-10-CM | POA: Diagnosis not present

## 2019-08-30 DIAGNOSIS — Z9981 Dependence on supplemental oxygen: Secondary | ICD-10-CM | POA: Diagnosis not present

## 2019-08-30 DIAGNOSIS — G4733 Obstructive sleep apnea (adult) (pediatric): Secondary | ICD-10-CM | POA: Diagnosis not present

## 2019-08-30 DIAGNOSIS — Z03818 Encounter for observation for suspected exposure to other biological agents ruled out: Secondary | ICD-10-CM | POA: Diagnosis not present

## 2019-08-30 DIAGNOSIS — I509 Heart failure, unspecified: Secondary | ICD-10-CM | POA: Diagnosis not present

## 2019-08-30 DIAGNOSIS — R0689 Other abnormalities of breathing: Secondary | ICD-10-CM

## 2019-08-30 DIAGNOSIS — Z86010 Personal history of colonic polyps: Secondary | ICD-10-CM | POA: Diagnosis not present

## 2019-08-30 DIAGNOSIS — J9621 Acute and chronic respiratory failure with hypoxia: Secondary | ICD-10-CM | POA: Diagnosis not present

## 2019-08-30 DIAGNOSIS — I4510 Unspecified right bundle-branch block: Secondary | ICD-10-CM | POA: Diagnosis not present

## 2019-08-30 DIAGNOSIS — J441 Chronic obstructive pulmonary disease with (acute) exacerbation: Secondary | ICD-10-CM | POA: Diagnosis not present

## 2019-08-30 DIAGNOSIS — Z79899 Other long term (current) drug therapy: Secondary | ICD-10-CM | POA: Diagnosis not present

## 2019-08-30 DIAGNOSIS — J9620 Acute and chronic respiratory failure, unspecified whether with hypoxia or hypercapnia: Secondary | ICD-10-CM | POA: Diagnosis not present

## 2019-08-30 DIAGNOSIS — J9622 Acute and chronic respiratory failure with hypercapnia: Secondary | ICD-10-CM | POA: Diagnosis not present

## 2019-08-30 DIAGNOSIS — J449 Chronic obstructive pulmonary disease, unspecified: Secondary | ICD-10-CM | POA: Diagnosis not present

## 2019-08-30 DIAGNOSIS — R05 Cough: Secondary | ICD-10-CM | POA: Diagnosis not present

## 2019-08-30 DIAGNOSIS — Z87891 Personal history of nicotine dependence: Secondary | ICD-10-CM | POA: Diagnosis not present

## 2019-08-30 DIAGNOSIS — J9690 Respiratory failure, unspecified, unspecified whether with hypoxia or hypercapnia: Secondary | ICD-10-CM | POA: Diagnosis not present

## 2019-08-30 DIAGNOSIS — R0789 Other chest pain: Secondary | ICD-10-CM | POA: Diagnosis not present

## 2019-08-30 DIAGNOSIS — J440 Chronic obstructive pulmonary disease with acute lower respiratory infection: Secondary | ICD-10-CM | POA: Diagnosis not present

## 2019-09-07 DIAGNOSIS — J449 Chronic obstructive pulmonary disease, unspecified: Secondary | ICD-10-CM | POA: Diagnosis not present

## 2019-09-07 DIAGNOSIS — R0600 Dyspnea, unspecified: Secondary | ICD-10-CM | POA: Diagnosis not present

## 2019-09-07 DIAGNOSIS — J189 Pneumonia, unspecified organism: Secondary | ICD-10-CM | POA: Diagnosis not present

## 2019-09-07 DIAGNOSIS — J9612 Chronic respiratory failure with hypercapnia: Secondary | ICD-10-CM | POA: Diagnosis not present

## 2019-09-07 DIAGNOSIS — G4733 Obstructive sleep apnea (adult) (pediatric): Secondary | ICD-10-CM | POA: Diagnosis not present

## 2019-09-08 DIAGNOSIS — D509 Iron deficiency anemia, unspecified: Secondary | ICD-10-CM | POA: Diagnosis not present

## 2019-09-08 DIAGNOSIS — M1990 Unspecified osteoarthritis, unspecified site: Secondary | ICD-10-CM | POA: Diagnosis not present

## 2019-09-08 DIAGNOSIS — Z7952 Long term (current) use of systemic steroids: Secondary | ICD-10-CM | POA: Diagnosis not present

## 2019-09-08 DIAGNOSIS — Z87891 Personal history of nicotine dependence: Secondary | ICD-10-CM | POA: Diagnosis not present

## 2019-09-08 DIAGNOSIS — Z9981 Dependence on supplemental oxygen: Secondary | ICD-10-CM | POA: Diagnosis not present

## 2019-09-08 DIAGNOSIS — E875 Hyperkalemia: Secondary | ICD-10-CM | POA: Diagnosis not present

## 2019-09-08 DIAGNOSIS — J9621 Acute and chronic respiratory failure with hypoxia: Secondary | ICD-10-CM | POA: Diagnosis not present

## 2019-09-08 DIAGNOSIS — Z85038 Personal history of other malignant neoplasm of large intestine: Secondary | ICD-10-CM | POA: Diagnosis not present

## 2019-09-08 DIAGNOSIS — G9341 Metabolic encephalopathy: Secondary | ICD-10-CM | POA: Diagnosis not present

## 2019-09-08 DIAGNOSIS — J9622 Acute and chronic respiratory failure with hypercapnia: Secondary | ICD-10-CM | POA: Diagnosis not present

## 2019-09-08 DIAGNOSIS — Z7982 Long term (current) use of aspirin: Secondary | ICD-10-CM | POA: Diagnosis not present

## 2019-09-08 DIAGNOSIS — A419 Sepsis, unspecified organism: Secondary | ICD-10-CM | POA: Diagnosis not present

## 2019-09-08 DIAGNOSIS — J438 Other emphysema: Secondary | ICD-10-CM | POA: Diagnosis not present

## 2019-09-08 DIAGNOSIS — J189 Pneumonia, unspecified organism: Secondary | ICD-10-CM | POA: Diagnosis not present

## 2019-09-08 DIAGNOSIS — E785 Hyperlipidemia, unspecified: Secondary | ICD-10-CM | POA: Diagnosis not present

## 2019-09-08 DIAGNOSIS — I5033 Acute on chronic diastolic (congestive) heart failure: Secondary | ICD-10-CM | POA: Diagnosis not present

## 2019-09-09 DIAGNOSIS — Z87891 Personal history of nicotine dependence: Secondary | ICD-10-CM | POA: Diagnosis not present

## 2019-09-09 DIAGNOSIS — I5033 Acute on chronic diastolic (congestive) heart failure: Secondary | ICD-10-CM | POA: Diagnosis not present

## 2019-09-09 DIAGNOSIS — J9622 Acute and chronic respiratory failure with hypercapnia: Secondary | ICD-10-CM | POA: Diagnosis not present

## 2019-09-09 DIAGNOSIS — G9341 Metabolic encephalopathy: Secondary | ICD-10-CM | POA: Diagnosis not present

## 2019-09-09 DIAGNOSIS — E875 Hyperkalemia: Secondary | ICD-10-CM | POA: Diagnosis not present

## 2019-09-09 DIAGNOSIS — M1990 Unspecified osteoarthritis, unspecified site: Secondary | ICD-10-CM | POA: Diagnosis not present

## 2019-09-09 DIAGNOSIS — J438 Other emphysema: Secondary | ICD-10-CM | POA: Diagnosis not present

## 2019-09-09 DIAGNOSIS — Z85038 Personal history of other malignant neoplasm of large intestine: Secondary | ICD-10-CM | POA: Diagnosis not present

## 2019-09-09 DIAGNOSIS — Z9981 Dependence on supplemental oxygen: Secondary | ICD-10-CM | POA: Diagnosis not present

## 2019-09-09 DIAGNOSIS — Z7952 Long term (current) use of systemic steroids: Secondary | ICD-10-CM | POA: Diagnosis not present

## 2019-09-09 DIAGNOSIS — E785 Hyperlipidemia, unspecified: Secondary | ICD-10-CM | POA: Diagnosis not present

## 2019-09-09 DIAGNOSIS — J189 Pneumonia, unspecified organism: Secondary | ICD-10-CM | POA: Diagnosis not present

## 2019-09-09 DIAGNOSIS — A419 Sepsis, unspecified organism: Secondary | ICD-10-CM | POA: Diagnosis not present

## 2019-09-09 DIAGNOSIS — J9621 Acute and chronic respiratory failure with hypoxia: Secondary | ICD-10-CM | POA: Diagnosis not present

## 2019-09-09 DIAGNOSIS — Z7982 Long term (current) use of aspirin: Secondary | ICD-10-CM | POA: Diagnosis not present

## 2019-09-09 DIAGNOSIS — D509 Iron deficiency anemia, unspecified: Secondary | ICD-10-CM | POA: Diagnosis not present

## 2019-09-10 DIAGNOSIS — Z7982 Long term (current) use of aspirin: Secondary | ICD-10-CM | POA: Diagnosis not present

## 2019-09-10 DIAGNOSIS — G9341 Metabolic encephalopathy: Secondary | ICD-10-CM | POA: Diagnosis not present

## 2019-09-10 DIAGNOSIS — E785 Hyperlipidemia, unspecified: Secondary | ICD-10-CM | POA: Diagnosis not present

## 2019-09-10 DIAGNOSIS — Z9981 Dependence on supplemental oxygen: Secondary | ICD-10-CM | POA: Diagnosis not present

## 2019-09-10 DIAGNOSIS — A419 Sepsis, unspecified organism: Secondary | ICD-10-CM | POA: Diagnosis not present

## 2019-09-10 DIAGNOSIS — J9622 Acute and chronic respiratory failure with hypercapnia: Secondary | ICD-10-CM | POA: Diagnosis not present

## 2019-09-10 DIAGNOSIS — J9621 Acute and chronic respiratory failure with hypoxia: Secondary | ICD-10-CM | POA: Diagnosis not present

## 2019-09-10 DIAGNOSIS — J189 Pneumonia, unspecified organism: Secondary | ICD-10-CM | POA: Diagnosis not present

## 2019-09-10 DIAGNOSIS — E875 Hyperkalemia: Secondary | ICD-10-CM | POA: Diagnosis not present

## 2019-09-10 DIAGNOSIS — J438 Other emphysema: Secondary | ICD-10-CM | POA: Diagnosis not present

## 2019-09-10 DIAGNOSIS — Z87891 Personal history of nicotine dependence: Secondary | ICD-10-CM | POA: Diagnosis not present

## 2019-09-10 DIAGNOSIS — D509 Iron deficiency anemia, unspecified: Secondary | ICD-10-CM | POA: Diagnosis not present

## 2019-09-10 DIAGNOSIS — M1990 Unspecified osteoarthritis, unspecified site: Secondary | ICD-10-CM | POA: Diagnosis not present

## 2019-09-10 DIAGNOSIS — I5033 Acute on chronic diastolic (congestive) heart failure: Secondary | ICD-10-CM | POA: Diagnosis not present

## 2019-09-10 DIAGNOSIS — Z7952 Long term (current) use of systemic steroids: Secondary | ICD-10-CM | POA: Diagnosis not present

## 2019-09-10 DIAGNOSIS — Z85038 Personal history of other malignant neoplasm of large intestine: Secondary | ICD-10-CM | POA: Diagnosis not present

## 2019-09-14 DIAGNOSIS — M1990 Unspecified osteoarthritis, unspecified site: Secondary | ICD-10-CM | POA: Diagnosis not present

## 2019-09-14 DIAGNOSIS — J9622 Acute and chronic respiratory failure with hypercapnia: Secondary | ICD-10-CM | POA: Diagnosis not present

## 2019-09-14 DIAGNOSIS — Z9981 Dependence on supplemental oxygen: Secondary | ICD-10-CM | POA: Diagnosis not present

## 2019-09-14 DIAGNOSIS — I5033 Acute on chronic diastolic (congestive) heart failure: Secondary | ICD-10-CM | POA: Diagnosis not present

## 2019-09-14 DIAGNOSIS — Z7952 Long term (current) use of systemic steroids: Secondary | ICD-10-CM | POA: Diagnosis not present

## 2019-09-14 DIAGNOSIS — G9341 Metabolic encephalopathy: Secondary | ICD-10-CM | POA: Diagnosis not present

## 2019-09-14 DIAGNOSIS — D509 Iron deficiency anemia, unspecified: Secondary | ICD-10-CM | POA: Diagnosis not present

## 2019-09-14 DIAGNOSIS — J9621 Acute and chronic respiratory failure with hypoxia: Secondary | ICD-10-CM | POA: Diagnosis not present

## 2019-09-14 DIAGNOSIS — Z85038 Personal history of other malignant neoplasm of large intestine: Secondary | ICD-10-CM | POA: Diagnosis not present

## 2019-09-14 DIAGNOSIS — Z87891 Personal history of nicotine dependence: Secondary | ICD-10-CM | POA: Diagnosis not present

## 2019-09-14 DIAGNOSIS — A419 Sepsis, unspecified organism: Secondary | ICD-10-CM | POA: Diagnosis not present

## 2019-09-14 DIAGNOSIS — Z7982 Long term (current) use of aspirin: Secondary | ICD-10-CM | POA: Diagnosis not present

## 2019-09-14 DIAGNOSIS — E785 Hyperlipidemia, unspecified: Secondary | ICD-10-CM | POA: Diagnosis not present

## 2019-09-14 DIAGNOSIS — E875 Hyperkalemia: Secondary | ICD-10-CM | POA: Diagnosis not present

## 2019-09-14 DIAGNOSIS — J189 Pneumonia, unspecified organism: Secondary | ICD-10-CM | POA: Diagnosis not present

## 2019-09-14 DIAGNOSIS — J438 Other emphysema: Secondary | ICD-10-CM | POA: Diagnosis not present

## 2019-09-15 DIAGNOSIS — E875 Hyperkalemia: Secondary | ICD-10-CM | POA: Diagnosis not present

## 2019-09-15 DIAGNOSIS — Z7952 Long term (current) use of systemic steroids: Secondary | ICD-10-CM | POA: Diagnosis not present

## 2019-09-15 DIAGNOSIS — Z85038 Personal history of other malignant neoplasm of large intestine: Secondary | ICD-10-CM | POA: Diagnosis not present

## 2019-09-15 DIAGNOSIS — M1990 Unspecified osteoarthritis, unspecified site: Secondary | ICD-10-CM | POA: Diagnosis not present

## 2019-09-15 DIAGNOSIS — Z9981 Dependence on supplemental oxygen: Secondary | ICD-10-CM | POA: Diagnosis not present

## 2019-09-15 DIAGNOSIS — Z87891 Personal history of nicotine dependence: Secondary | ICD-10-CM | POA: Diagnosis not present

## 2019-09-15 DIAGNOSIS — J438 Other emphysema: Secondary | ICD-10-CM | POA: Diagnosis not present

## 2019-09-15 DIAGNOSIS — Z7982 Long term (current) use of aspirin: Secondary | ICD-10-CM | POA: Diagnosis not present

## 2019-09-15 DIAGNOSIS — J9621 Acute and chronic respiratory failure with hypoxia: Secondary | ICD-10-CM | POA: Diagnosis not present

## 2019-09-15 DIAGNOSIS — G9341 Metabolic encephalopathy: Secondary | ICD-10-CM | POA: Diagnosis not present

## 2019-09-15 DIAGNOSIS — E785 Hyperlipidemia, unspecified: Secondary | ICD-10-CM | POA: Diagnosis not present

## 2019-09-15 DIAGNOSIS — I5033 Acute on chronic diastolic (congestive) heart failure: Secondary | ICD-10-CM | POA: Diagnosis not present

## 2019-09-15 DIAGNOSIS — A419 Sepsis, unspecified organism: Secondary | ICD-10-CM | POA: Diagnosis not present

## 2019-09-15 DIAGNOSIS — J189 Pneumonia, unspecified organism: Secondary | ICD-10-CM | POA: Diagnosis not present

## 2019-09-15 DIAGNOSIS — J9622 Acute and chronic respiratory failure with hypercapnia: Secondary | ICD-10-CM | POA: Diagnosis not present

## 2019-09-15 DIAGNOSIS — D509 Iron deficiency anemia, unspecified: Secondary | ICD-10-CM | POA: Diagnosis not present

## 2019-09-16 DIAGNOSIS — G4733 Obstructive sleep apnea (adult) (pediatric): Secondary | ICD-10-CM | POA: Diagnosis not present

## 2019-09-16 DIAGNOSIS — J9612 Chronic respiratory failure with hypercapnia: Secondary | ICD-10-CM | POA: Diagnosis not present

## 2019-09-16 DIAGNOSIS — J449 Chronic obstructive pulmonary disease, unspecified: Secondary | ICD-10-CM | POA: Diagnosis not present

## 2019-09-16 DIAGNOSIS — R0600 Dyspnea, unspecified: Secondary | ICD-10-CM | POA: Diagnosis not present

## 2019-09-17 DIAGNOSIS — J438 Other emphysema: Secondary | ICD-10-CM | POA: Diagnosis not present

## 2019-09-17 DIAGNOSIS — I5033 Acute on chronic diastolic (congestive) heart failure: Secondary | ICD-10-CM | POA: Diagnosis not present

## 2019-09-17 DIAGNOSIS — J9621 Acute and chronic respiratory failure with hypoxia: Secondary | ICD-10-CM | POA: Diagnosis not present

## 2019-09-17 DIAGNOSIS — Z9981 Dependence on supplemental oxygen: Secondary | ICD-10-CM | POA: Diagnosis not present

## 2019-09-17 DIAGNOSIS — J449 Chronic obstructive pulmonary disease, unspecified: Secondary | ICD-10-CM | POA: Diagnosis not present

## 2019-09-17 DIAGNOSIS — Z7952 Long term (current) use of systemic steroids: Secondary | ICD-10-CM | POA: Diagnosis not present

## 2019-09-17 DIAGNOSIS — A419 Sepsis, unspecified organism: Secondary | ICD-10-CM | POA: Diagnosis not present

## 2019-09-17 DIAGNOSIS — E875 Hyperkalemia: Secondary | ICD-10-CM | POA: Diagnosis not present

## 2019-09-17 DIAGNOSIS — D509 Iron deficiency anemia, unspecified: Secondary | ICD-10-CM | POA: Diagnosis not present

## 2019-09-17 DIAGNOSIS — Z85038 Personal history of other malignant neoplasm of large intestine: Secondary | ICD-10-CM | POA: Diagnosis not present

## 2019-09-17 DIAGNOSIS — E785 Hyperlipidemia, unspecified: Secondary | ICD-10-CM | POA: Diagnosis not present

## 2019-09-17 DIAGNOSIS — J9622 Acute and chronic respiratory failure with hypercapnia: Secondary | ICD-10-CM | POA: Diagnosis not present

## 2019-09-17 DIAGNOSIS — Z7982 Long term (current) use of aspirin: Secondary | ICD-10-CM | POA: Diagnosis not present

## 2019-09-17 DIAGNOSIS — J189 Pneumonia, unspecified organism: Secondary | ICD-10-CM | POA: Diagnosis not present

## 2019-09-17 DIAGNOSIS — M1990 Unspecified osteoarthritis, unspecified site: Secondary | ICD-10-CM | POA: Diagnosis not present

## 2019-09-17 DIAGNOSIS — G9341 Metabolic encephalopathy: Secondary | ICD-10-CM | POA: Diagnosis not present

## 2019-09-17 DIAGNOSIS — Z87891 Personal history of nicotine dependence: Secondary | ICD-10-CM | POA: Diagnosis not present

## 2019-09-20 DIAGNOSIS — J9622 Acute and chronic respiratory failure with hypercapnia: Secondary | ICD-10-CM | POA: Diagnosis not present

## 2019-09-20 DIAGNOSIS — E785 Hyperlipidemia, unspecified: Secondary | ICD-10-CM | POA: Diagnosis not present

## 2019-09-20 DIAGNOSIS — J438 Other emphysema: Secondary | ICD-10-CM | POA: Diagnosis not present

## 2019-09-20 DIAGNOSIS — Z9981 Dependence on supplemental oxygen: Secondary | ICD-10-CM | POA: Diagnosis not present

## 2019-09-20 DIAGNOSIS — G9341 Metabolic encephalopathy: Secondary | ICD-10-CM | POA: Diagnosis not present

## 2019-09-20 DIAGNOSIS — D509 Iron deficiency anemia, unspecified: Secondary | ICD-10-CM | POA: Diagnosis not present

## 2019-09-20 DIAGNOSIS — Z7982 Long term (current) use of aspirin: Secondary | ICD-10-CM | POA: Diagnosis not present

## 2019-09-20 DIAGNOSIS — J189 Pneumonia, unspecified organism: Secondary | ICD-10-CM | POA: Diagnosis not present

## 2019-09-20 DIAGNOSIS — J9621 Acute and chronic respiratory failure with hypoxia: Secondary | ICD-10-CM | POA: Diagnosis not present

## 2019-09-20 DIAGNOSIS — I5033 Acute on chronic diastolic (congestive) heart failure: Secondary | ICD-10-CM | POA: Diagnosis not present

## 2019-09-20 DIAGNOSIS — E875 Hyperkalemia: Secondary | ICD-10-CM | POA: Diagnosis not present

## 2019-09-20 DIAGNOSIS — A419 Sepsis, unspecified organism: Secondary | ICD-10-CM | POA: Diagnosis not present

## 2019-09-20 DIAGNOSIS — M1990 Unspecified osteoarthritis, unspecified site: Secondary | ICD-10-CM | POA: Diagnosis not present

## 2019-09-20 DIAGNOSIS — Z7952 Long term (current) use of systemic steroids: Secondary | ICD-10-CM | POA: Diagnosis not present

## 2019-09-20 DIAGNOSIS — Z87891 Personal history of nicotine dependence: Secondary | ICD-10-CM | POA: Diagnosis not present

## 2019-09-20 DIAGNOSIS — Z85038 Personal history of other malignant neoplasm of large intestine: Secondary | ICD-10-CM | POA: Diagnosis not present

## 2019-09-22 DIAGNOSIS — J9622 Acute and chronic respiratory failure with hypercapnia: Secondary | ICD-10-CM | POA: Diagnosis not present

## 2019-09-22 DIAGNOSIS — Z7952 Long term (current) use of systemic steroids: Secondary | ICD-10-CM | POA: Diagnosis not present

## 2019-09-22 DIAGNOSIS — Z7982 Long term (current) use of aspirin: Secondary | ICD-10-CM | POA: Diagnosis not present

## 2019-09-22 DIAGNOSIS — E785 Hyperlipidemia, unspecified: Secondary | ICD-10-CM | POA: Diagnosis not present

## 2019-09-22 DIAGNOSIS — J438 Other emphysema: Secondary | ICD-10-CM | POA: Diagnosis not present

## 2019-09-22 DIAGNOSIS — D509 Iron deficiency anemia, unspecified: Secondary | ICD-10-CM | POA: Diagnosis not present

## 2019-09-22 DIAGNOSIS — I5033 Acute on chronic diastolic (congestive) heart failure: Secondary | ICD-10-CM | POA: Diagnosis not present

## 2019-09-22 DIAGNOSIS — G9341 Metabolic encephalopathy: Secondary | ICD-10-CM | POA: Diagnosis not present

## 2019-09-22 DIAGNOSIS — A419 Sepsis, unspecified organism: Secondary | ICD-10-CM | POA: Diagnosis not present

## 2019-09-22 DIAGNOSIS — J189 Pneumonia, unspecified organism: Secondary | ICD-10-CM | POA: Diagnosis not present

## 2019-09-22 DIAGNOSIS — Z87891 Personal history of nicotine dependence: Secondary | ICD-10-CM | POA: Diagnosis not present

## 2019-09-22 DIAGNOSIS — J9621 Acute and chronic respiratory failure with hypoxia: Secondary | ICD-10-CM | POA: Diagnosis not present

## 2019-09-22 DIAGNOSIS — E875 Hyperkalemia: Secondary | ICD-10-CM | POA: Diagnosis not present

## 2019-09-22 DIAGNOSIS — M1990 Unspecified osteoarthritis, unspecified site: Secondary | ICD-10-CM | POA: Diagnosis not present

## 2019-09-22 DIAGNOSIS — Z9981 Dependence on supplemental oxygen: Secondary | ICD-10-CM | POA: Diagnosis not present

## 2019-09-22 DIAGNOSIS — Z85038 Personal history of other malignant neoplasm of large intestine: Secondary | ICD-10-CM | POA: Diagnosis not present

## 2019-09-27 DIAGNOSIS — Z9981 Dependence on supplemental oxygen: Secondary | ICD-10-CM | POA: Diagnosis not present

## 2019-09-27 DIAGNOSIS — G9341 Metabolic encephalopathy: Secondary | ICD-10-CM | POA: Diagnosis not present

## 2019-09-27 DIAGNOSIS — J9622 Acute and chronic respiratory failure with hypercapnia: Secondary | ICD-10-CM | POA: Diagnosis not present

## 2019-09-27 DIAGNOSIS — J189 Pneumonia, unspecified organism: Secondary | ICD-10-CM | POA: Diagnosis not present

## 2019-09-27 DIAGNOSIS — E875 Hyperkalemia: Secondary | ICD-10-CM | POA: Diagnosis not present

## 2019-09-27 DIAGNOSIS — Z87891 Personal history of nicotine dependence: Secondary | ICD-10-CM | POA: Diagnosis not present

## 2019-09-27 DIAGNOSIS — E785 Hyperlipidemia, unspecified: Secondary | ICD-10-CM | POA: Diagnosis not present

## 2019-09-27 DIAGNOSIS — A419 Sepsis, unspecified organism: Secondary | ICD-10-CM | POA: Diagnosis not present

## 2019-09-27 DIAGNOSIS — I5033 Acute on chronic diastolic (congestive) heart failure: Secondary | ICD-10-CM | POA: Diagnosis not present

## 2019-09-27 DIAGNOSIS — J9621 Acute and chronic respiratory failure with hypoxia: Secondary | ICD-10-CM | POA: Diagnosis not present

## 2019-09-27 DIAGNOSIS — Z7952 Long term (current) use of systemic steroids: Secondary | ICD-10-CM | POA: Diagnosis not present

## 2019-09-27 DIAGNOSIS — J438 Other emphysema: Secondary | ICD-10-CM | POA: Diagnosis not present

## 2019-09-27 DIAGNOSIS — Z7982 Long term (current) use of aspirin: Secondary | ICD-10-CM | POA: Diagnosis not present

## 2019-09-27 DIAGNOSIS — D509 Iron deficiency anemia, unspecified: Secondary | ICD-10-CM | POA: Diagnosis not present

## 2019-09-27 DIAGNOSIS — M1990 Unspecified osteoarthritis, unspecified site: Secondary | ICD-10-CM | POA: Diagnosis not present

## 2019-09-27 DIAGNOSIS — Z85038 Personal history of other malignant neoplasm of large intestine: Secondary | ICD-10-CM | POA: Diagnosis not present

## 2019-09-29 DIAGNOSIS — Z7952 Long term (current) use of systemic steroids: Secondary | ICD-10-CM | POA: Diagnosis not present

## 2019-09-29 DIAGNOSIS — I5033 Acute on chronic diastolic (congestive) heart failure: Secondary | ICD-10-CM | POA: Diagnosis not present

## 2019-09-29 DIAGNOSIS — J438 Other emphysema: Secondary | ICD-10-CM | POA: Diagnosis not present

## 2019-09-29 DIAGNOSIS — G9341 Metabolic encephalopathy: Secondary | ICD-10-CM | POA: Diagnosis not present

## 2019-09-29 DIAGNOSIS — A419 Sepsis, unspecified organism: Secondary | ICD-10-CM | POA: Diagnosis not present

## 2019-09-29 DIAGNOSIS — J9622 Acute and chronic respiratory failure with hypercapnia: Secondary | ICD-10-CM | POA: Diagnosis not present

## 2019-09-29 DIAGNOSIS — E875 Hyperkalemia: Secondary | ICD-10-CM | POA: Diagnosis not present

## 2019-09-29 DIAGNOSIS — E785 Hyperlipidemia, unspecified: Secondary | ICD-10-CM | POA: Diagnosis not present

## 2019-09-29 DIAGNOSIS — Z9981 Dependence on supplemental oxygen: Secondary | ICD-10-CM | POA: Diagnosis not present

## 2019-09-29 DIAGNOSIS — J189 Pneumonia, unspecified organism: Secondary | ICD-10-CM | POA: Diagnosis not present

## 2019-09-29 DIAGNOSIS — Z85038 Personal history of other malignant neoplasm of large intestine: Secondary | ICD-10-CM | POA: Diagnosis not present

## 2019-09-29 DIAGNOSIS — J9621 Acute and chronic respiratory failure with hypoxia: Secondary | ICD-10-CM | POA: Diagnosis not present

## 2019-09-29 DIAGNOSIS — Z7982 Long term (current) use of aspirin: Secondary | ICD-10-CM | POA: Diagnosis not present

## 2019-09-29 DIAGNOSIS — Z87891 Personal history of nicotine dependence: Secondary | ICD-10-CM | POA: Diagnosis not present

## 2019-09-29 DIAGNOSIS — D509 Iron deficiency anemia, unspecified: Secondary | ICD-10-CM | POA: Diagnosis not present

## 2019-09-29 DIAGNOSIS — M1990 Unspecified osteoarthritis, unspecified site: Secondary | ICD-10-CM | POA: Diagnosis not present

## 2019-10-05 DIAGNOSIS — J9811 Atelectasis: Secondary | ICD-10-CM | POA: Diagnosis not present

## 2019-10-06 DIAGNOSIS — Z87891 Personal history of nicotine dependence: Secondary | ICD-10-CM | POA: Diagnosis not present

## 2019-10-06 DIAGNOSIS — G4733 Obstructive sleep apnea (adult) (pediatric): Secondary | ICD-10-CM | POA: Diagnosis not present

## 2019-10-06 DIAGNOSIS — J438 Other emphysema: Secondary | ICD-10-CM | POA: Diagnosis not present

## 2019-10-06 DIAGNOSIS — E785 Hyperlipidemia, unspecified: Secondary | ICD-10-CM | POA: Diagnosis not present

## 2019-10-06 DIAGNOSIS — Z9981 Dependence on supplemental oxygen: Secondary | ICD-10-CM | POA: Diagnosis not present

## 2019-10-06 DIAGNOSIS — Z7952 Long term (current) use of systemic steroids: Secondary | ICD-10-CM | POA: Diagnosis not present

## 2019-10-06 DIAGNOSIS — J9621 Acute and chronic respiratory failure with hypoxia: Secondary | ICD-10-CM | POA: Diagnosis not present

## 2019-10-06 DIAGNOSIS — J189 Pneumonia, unspecified organism: Secondary | ICD-10-CM | POA: Diagnosis not present

## 2019-10-06 DIAGNOSIS — I5033 Acute on chronic diastolic (congestive) heart failure: Secondary | ICD-10-CM | POA: Diagnosis not present

## 2019-10-06 DIAGNOSIS — D509 Iron deficiency anemia, unspecified: Secondary | ICD-10-CM | POA: Diagnosis not present

## 2019-10-06 DIAGNOSIS — G9341 Metabolic encephalopathy: Secondary | ICD-10-CM | POA: Diagnosis not present

## 2019-10-06 DIAGNOSIS — M1990 Unspecified osteoarthritis, unspecified site: Secondary | ICD-10-CM | POA: Diagnosis not present

## 2019-10-06 DIAGNOSIS — Z85038 Personal history of other malignant neoplasm of large intestine: Secondary | ICD-10-CM | POA: Diagnosis not present

## 2019-10-06 DIAGNOSIS — A419 Sepsis, unspecified organism: Secondary | ICD-10-CM | POA: Diagnosis not present

## 2019-10-06 DIAGNOSIS — Z7982 Long term (current) use of aspirin: Secondary | ICD-10-CM | POA: Diagnosis not present

## 2019-10-06 DIAGNOSIS — J9622 Acute and chronic respiratory failure with hypercapnia: Secondary | ICD-10-CM | POA: Diagnosis not present

## 2019-10-06 DIAGNOSIS — E875 Hyperkalemia: Secondary | ICD-10-CM | POA: Diagnosis not present

## 2019-10-07 DIAGNOSIS — J438 Other emphysema: Secondary | ICD-10-CM | POA: Diagnosis not present

## 2019-10-07 DIAGNOSIS — I5033 Acute on chronic diastolic (congestive) heart failure: Secondary | ICD-10-CM | POA: Diagnosis not present

## 2019-10-07 DIAGNOSIS — Z9981 Dependence on supplemental oxygen: Secondary | ICD-10-CM | POA: Diagnosis not present

## 2019-10-07 DIAGNOSIS — E875 Hyperkalemia: Secondary | ICD-10-CM | POA: Diagnosis not present

## 2019-10-07 DIAGNOSIS — G9341 Metabolic encephalopathy: Secondary | ICD-10-CM | POA: Diagnosis not present

## 2019-10-07 DIAGNOSIS — Z7982 Long term (current) use of aspirin: Secondary | ICD-10-CM | POA: Diagnosis not present

## 2019-10-07 DIAGNOSIS — E785 Hyperlipidemia, unspecified: Secondary | ICD-10-CM | POA: Diagnosis not present

## 2019-10-07 DIAGNOSIS — A419 Sepsis, unspecified organism: Secondary | ICD-10-CM | POA: Diagnosis not present

## 2019-10-07 DIAGNOSIS — J189 Pneumonia, unspecified organism: Secondary | ICD-10-CM | POA: Diagnosis not present

## 2019-10-07 DIAGNOSIS — Z85038 Personal history of other malignant neoplasm of large intestine: Secondary | ICD-10-CM | POA: Diagnosis not present

## 2019-10-07 DIAGNOSIS — M1990 Unspecified osteoarthritis, unspecified site: Secondary | ICD-10-CM | POA: Diagnosis not present

## 2019-10-07 DIAGNOSIS — D509 Iron deficiency anemia, unspecified: Secondary | ICD-10-CM | POA: Diagnosis not present

## 2019-10-07 DIAGNOSIS — Z7952 Long term (current) use of systemic steroids: Secondary | ICD-10-CM | POA: Diagnosis not present

## 2019-10-07 DIAGNOSIS — J9622 Acute and chronic respiratory failure with hypercapnia: Secondary | ICD-10-CM | POA: Diagnosis not present

## 2019-10-07 DIAGNOSIS — Z87891 Personal history of nicotine dependence: Secondary | ICD-10-CM | POA: Diagnosis not present

## 2019-10-07 DIAGNOSIS — J9621 Acute and chronic respiratory failure with hypoxia: Secondary | ICD-10-CM | POA: Diagnosis not present

## 2019-10-08 DIAGNOSIS — A419 Sepsis, unspecified organism: Secondary | ICD-10-CM | POA: Diagnosis not present

## 2019-10-08 DIAGNOSIS — J9622 Acute and chronic respiratory failure with hypercapnia: Secondary | ICD-10-CM | POA: Diagnosis not present

## 2019-10-08 DIAGNOSIS — I5033 Acute on chronic diastolic (congestive) heart failure: Secondary | ICD-10-CM | POA: Diagnosis not present

## 2019-10-08 DIAGNOSIS — Z85038 Personal history of other malignant neoplasm of large intestine: Secondary | ICD-10-CM | POA: Diagnosis not present

## 2019-10-08 DIAGNOSIS — J9621 Acute and chronic respiratory failure with hypoxia: Secondary | ICD-10-CM | POA: Diagnosis not present

## 2019-10-08 DIAGNOSIS — D509 Iron deficiency anemia, unspecified: Secondary | ICD-10-CM | POA: Diagnosis not present

## 2019-10-08 DIAGNOSIS — M1990 Unspecified osteoarthritis, unspecified site: Secondary | ICD-10-CM | POA: Diagnosis not present

## 2019-10-08 DIAGNOSIS — Z9981 Dependence on supplemental oxygen: Secondary | ICD-10-CM | POA: Diagnosis not present

## 2019-10-08 DIAGNOSIS — Z87891 Personal history of nicotine dependence: Secondary | ICD-10-CM | POA: Diagnosis not present

## 2019-10-08 DIAGNOSIS — Z7982 Long term (current) use of aspirin: Secondary | ICD-10-CM | POA: Diagnosis not present

## 2019-10-08 DIAGNOSIS — Z7952 Long term (current) use of systemic steroids: Secondary | ICD-10-CM | POA: Diagnosis not present

## 2019-10-08 DIAGNOSIS — J438 Other emphysema: Secondary | ICD-10-CM | POA: Diagnosis not present

## 2019-10-08 DIAGNOSIS — G9341 Metabolic encephalopathy: Secondary | ICD-10-CM | POA: Diagnosis not present

## 2019-10-08 DIAGNOSIS — E875 Hyperkalemia: Secondary | ICD-10-CM | POA: Diagnosis not present

## 2019-10-08 DIAGNOSIS — E785 Hyperlipidemia, unspecified: Secondary | ICD-10-CM | POA: Diagnosis not present

## 2019-10-08 DIAGNOSIS — J189 Pneumonia, unspecified organism: Secondary | ICD-10-CM | POA: Diagnosis not present

## 2019-10-13 DIAGNOSIS — J9612 Chronic respiratory failure with hypercapnia: Secondary | ICD-10-CM | POA: Diagnosis not present

## 2019-10-13 DIAGNOSIS — R0600 Dyspnea, unspecified: Secondary | ICD-10-CM | POA: Diagnosis not present

## 2019-10-13 DIAGNOSIS — J449 Chronic obstructive pulmonary disease, unspecified: Secondary | ICD-10-CM | POA: Diagnosis not present

## 2019-10-13 DIAGNOSIS — G4733 Obstructive sleep apnea (adult) (pediatric): Secondary | ICD-10-CM | POA: Diagnosis not present

## 2019-10-18 DIAGNOSIS — J449 Chronic obstructive pulmonary disease, unspecified: Secondary | ICD-10-CM | POA: Diagnosis not present

## 2019-10-21 DIAGNOSIS — D509 Iron deficiency anemia, unspecified: Secondary | ICD-10-CM | POA: Diagnosis not present

## 2019-10-21 DIAGNOSIS — G9341 Metabolic encephalopathy: Secondary | ICD-10-CM | POA: Diagnosis not present

## 2019-10-21 DIAGNOSIS — A419 Sepsis, unspecified organism: Secondary | ICD-10-CM | POA: Diagnosis not present

## 2019-10-21 DIAGNOSIS — Z9981 Dependence on supplemental oxygen: Secondary | ICD-10-CM | POA: Diagnosis not present

## 2019-10-21 DIAGNOSIS — J438 Other emphysema: Secondary | ICD-10-CM | POA: Diagnosis not present

## 2019-10-21 DIAGNOSIS — Z87891 Personal history of nicotine dependence: Secondary | ICD-10-CM | POA: Diagnosis not present

## 2019-10-21 DIAGNOSIS — I5033 Acute on chronic diastolic (congestive) heart failure: Secondary | ICD-10-CM | POA: Diagnosis not present

## 2019-10-21 DIAGNOSIS — J9621 Acute and chronic respiratory failure with hypoxia: Secondary | ICD-10-CM | POA: Diagnosis not present

## 2019-10-21 DIAGNOSIS — J449 Chronic obstructive pulmonary disease, unspecified: Secondary | ICD-10-CM | POA: Diagnosis not present

## 2019-10-21 DIAGNOSIS — E875 Hyperkalemia: Secondary | ICD-10-CM | POA: Diagnosis not present

## 2019-10-21 DIAGNOSIS — Z7952 Long term (current) use of systemic steroids: Secondary | ICD-10-CM | POA: Diagnosis not present

## 2019-10-21 DIAGNOSIS — M1990 Unspecified osteoarthritis, unspecified site: Secondary | ICD-10-CM | POA: Diagnosis not present

## 2019-10-21 DIAGNOSIS — E785 Hyperlipidemia, unspecified: Secondary | ICD-10-CM | POA: Diagnosis not present

## 2019-10-21 DIAGNOSIS — Z7982 Long term (current) use of aspirin: Secondary | ICD-10-CM | POA: Diagnosis not present

## 2019-10-21 DIAGNOSIS — J189 Pneumonia, unspecified organism: Secondary | ICD-10-CM | POA: Diagnosis not present

## 2019-10-21 DIAGNOSIS — J9622 Acute and chronic respiratory failure with hypercapnia: Secondary | ICD-10-CM | POA: Diagnosis not present

## 2019-10-21 DIAGNOSIS — Z85038 Personal history of other malignant neoplasm of large intestine: Secondary | ICD-10-CM | POA: Diagnosis not present

## 2019-10-26 DIAGNOSIS — Z7982 Long term (current) use of aspirin: Secondary | ICD-10-CM | POA: Diagnosis not present

## 2019-10-26 DIAGNOSIS — Z7952 Long term (current) use of systemic steroids: Secondary | ICD-10-CM | POA: Diagnosis not present

## 2019-10-26 DIAGNOSIS — J438 Other emphysema: Secondary | ICD-10-CM | POA: Diagnosis not present

## 2019-10-26 DIAGNOSIS — I5033 Acute on chronic diastolic (congestive) heart failure: Secondary | ICD-10-CM | POA: Diagnosis not present

## 2019-10-26 DIAGNOSIS — G9341 Metabolic encephalopathy: Secondary | ICD-10-CM | POA: Diagnosis not present

## 2019-10-26 DIAGNOSIS — J9621 Acute and chronic respiratory failure with hypoxia: Secondary | ICD-10-CM | POA: Diagnosis not present

## 2019-10-26 DIAGNOSIS — A419 Sepsis, unspecified organism: Secondary | ICD-10-CM | POA: Diagnosis not present

## 2019-10-26 DIAGNOSIS — Z85038 Personal history of other malignant neoplasm of large intestine: Secondary | ICD-10-CM | POA: Diagnosis not present

## 2019-10-26 DIAGNOSIS — Z9981 Dependence on supplemental oxygen: Secondary | ICD-10-CM | POA: Diagnosis not present

## 2019-10-26 DIAGNOSIS — J9622 Acute and chronic respiratory failure with hypercapnia: Secondary | ICD-10-CM | POA: Diagnosis not present

## 2019-10-26 DIAGNOSIS — Z87891 Personal history of nicotine dependence: Secondary | ICD-10-CM | POA: Diagnosis not present

## 2019-10-26 DIAGNOSIS — E875 Hyperkalemia: Secondary | ICD-10-CM | POA: Diagnosis not present

## 2019-10-26 DIAGNOSIS — D509 Iron deficiency anemia, unspecified: Secondary | ICD-10-CM | POA: Diagnosis not present

## 2019-10-26 DIAGNOSIS — E785 Hyperlipidemia, unspecified: Secondary | ICD-10-CM | POA: Diagnosis not present

## 2019-10-26 DIAGNOSIS — M1990 Unspecified osteoarthritis, unspecified site: Secondary | ICD-10-CM | POA: Diagnosis not present

## 2019-10-26 DIAGNOSIS — J189 Pneumonia, unspecified organism: Secondary | ICD-10-CM | POA: Diagnosis not present

## 2019-10-27 DIAGNOSIS — R918 Other nonspecific abnormal finding of lung field: Secondary | ICD-10-CM | POA: Diagnosis not present

## 2019-10-27 DIAGNOSIS — J440 Chronic obstructive pulmonary disease with acute lower respiratory infection: Secondary | ICD-10-CM | POA: Diagnosis not present

## 2019-10-27 DIAGNOSIS — I5033 Acute on chronic diastolic (congestive) heart failure: Secondary | ICD-10-CM | POA: Diagnosis not present

## 2019-10-27 DIAGNOSIS — R0602 Shortness of breath: Secondary | ICD-10-CM | POA: Diagnosis not present

## 2019-10-27 DIAGNOSIS — I110 Hypertensive heart disease with heart failure: Secondary | ICD-10-CM | POA: Diagnosis not present

## 2019-10-27 DIAGNOSIS — I5030 Unspecified diastolic (congestive) heart failure: Secondary | ICD-10-CM

## 2019-10-27 DIAGNOSIS — R7989 Other specified abnormal findings of blood chemistry: Secondary | ICD-10-CM | POA: Diagnosis not present

## 2019-10-27 DIAGNOSIS — J441 Chronic obstructive pulmonary disease with (acute) exacerbation: Secondary | ICD-10-CM | POA: Diagnosis not present

## 2019-10-27 DIAGNOSIS — J9622 Acute and chronic respiratory failure with hypercapnia: Secondary | ICD-10-CM | POA: Diagnosis not present

## 2019-10-27 DIAGNOSIS — D649 Anemia, unspecified: Secondary | ICD-10-CM | POA: Diagnosis not present

## 2019-10-27 DIAGNOSIS — E785 Hyperlipidemia, unspecified: Secondary | ICD-10-CM | POA: Diagnosis not present

## 2019-10-27 DIAGNOSIS — J80 Acute respiratory distress syndrome: Secondary | ICD-10-CM | POA: Diagnosis not present

## 2019-10-27 DIAGNOSIS — J9621 Acute and chronic respiratory failure with hypoxia: Secondary | ICD-10-CM | POA: Diagnosis not present

## 2019-10-27 DIAGNOSIS — Z7982 Long term (current) use of aspirin: Secondary | ICD-10-CM | POA: Diagnosis not present

## 2019-10-27 DIAGNOSIS — R0689 Other abnormalities of breathing: Secondary | ICD-10-CM | POA: Diagnosis not present

## 2019-10-27 DIAGNOSIS — R739 Hyperglycemia, unspecified: Secondary | ICD-10-CM | POA: Diagnosis not present

## 2019-10-27 DIAGNOSIS — J9601 Acute respiratory failure with hypoxia: Secondary | ICD-10-CM | POA: Diagnosis not present

## 2019-10-27 DIAGNOSIS — Z9981 Dependence on supplemental oxygen: Secondary | ICD-10-CM | POA: Diagnosis not present

## 2019-10-27 DIAGNOSIS — Z743 Need for continuous supervision: Secondary | ICD-10-CM | POA: Diagnosis not present

## 2019-10-27 DIAGNOSIS — U071 COVID-19: Secondary | ICD-10-CM

## 2019-10-27 DIAGNOSIS — D6489 Other specified anemias: Secondary | ICD-10-CM | POA: Diagnosis not present

## 2019-10-27 DIAGNOSIS — R9431 Abnormal electrocardiogram [ECG] [EKG]: Secondary | ICD-10-CM | POA: Diagnosis not present

## 2019-10-27 DIAGNOSIS — J449 Chronic obstructive pulmonary disease, unspecified: Secondary | ICD-10-CM | POA: Diagnosis not present

## 2019-10-27 DIAGNOSIS — R0603 Acute respiratory distress: Secondary | ICD-10-CM | POA: Diagnosis not present

## 2019-10-27 DIAGNOSIS — Z9119 Patient's noncompliance with other medical treatment and regimen: Secondary | ICD-10-CM | POA: Diagnosis not present

## 2019-10-27 DIAGNOSIS — J189 Pneumonia, unspecified organism: Secondary | ICD-10-CM | POA: Diagnosis not present

## 2019-10-27 DIAGNOSIS — D509 Iron deficiency anemia, unspecified: Secondary | ICD-10-CM | POA: Diagnosis not present

## 2019-10-27 DIAGNOSIS — M1990 Unspecified osteoarthritis, unspecified site: Secondary | ICD-10-CM | POA: Diagnosis not present

## 2019-10-28 ENCOUNTER — Inpatient Hospital Stay (HOSPITAL_COMMUNITY)
Admission: AD | Admit: 2019-10-28 | Discharge: 2019-11-01 | DRG: 177 | Disposition: A | Discharge status: Home Health | Payer: Medicare Other | Source: Other Acute Inpatient Hospital | Attending: Internal Medicine | Admitting: Internal Medicine

## 2019-10-28 ENCOUNTER — Inpatient Hospital Stay (HOSPITAL_COMMUNITY): Payer: Medicare Other

## 2019-10-28 ENCOUNTER — Other Ambulatory Visit: Payer: Self-pay

## 2019-10-28 ENCOUNTER — Encounter (HOSPITAL_COMMUNITY): Payer: Self-pay | Admitting: Internal Medicine

## 2019-10-28 DIAGNOSIS — D649 Anemia, unspecified: Secondary | ICD-10-CM | POA: Diagnosis not present

## 2019-10-28 DIAGNOSIS — Z8546 Personal history of malignant neoplasm of prostate: Secondary | ICD-10-CM | POA: Diagnosis not present

## 2019-10-28 DIAGNOSIS — J9611 Chronic respiratory failure with hypoxia: Secondary | ICD-10-CM | POA: Diagnosis not present

## 2019-10-28 DIAGNOSIS — R0602 Shortness of breath: Secondary | ICD-10-CM | POA: Diagnosis not present

## 2019-10-28 DIAGNOSIS — I110 Hypertensive heart disease with heart failure: Secondary | ICD-10-CM | POA: Diagnosis present

## 2019-10-28 DIAGNOSIS — J4489 Other specified chronic obstructive pulmonary disease: Secondary | ICD-10-CM | POA: Diagnosis present

## 2019-10-28 DIAGNOSIS — J449 Chronic obstructive pulmonary disease, unspecified: Secondary | ICD-10-CM | POA: Diagnosis present

## 2019-10-28 DIAGNOSIS — J9601 Acute respiratory failure with hypoxia: Secondary | ICD-10-CM | POA: Diagnosis present

## 2019-10-28 DIAGNOSIS — J9621 Acute and chronic respiratory failure with hypoxia: Secondary | ICD-10-CM | POA: Diagnosis present

## 2019-10-28 DIAGNOSIS — Z66 Do not resuscitate: Secondary | ICD-10-CM | POA: Diagnosis not present

## 2019-10-28 DIAGNOSIS — U071 COVID-19: Principal | ICD-10-CM

## 2019-10-28 DIAGNOSIS — E785 Hyperlipidemia, unspecified: Secondary | ICD-10-CM | POA: Diagnosis present

## 2019-10-28 DIAGNOSIS — R0689 Other abnormalities of breathing: Secondary | ICD-10-CM | POA: Diagnosis not present

## 2019-10-28 DIAGNOSIS — I5032 Chronic diastolic (congestive) heart failure: Secondary | ICD-10-CM | POA: Diagnosis present

## 2019-10-28 DIAGNOSIS — J1282 Pneumonia due to coronavirus disease 2019: Secondary | ICD-10-CM | POA: Diagnosis not present

## 2019-10-28 DIAGNOSIS — C61 Malignant neoplasm of prostate: Secondary | ICD-10-CM | POA: Diagnosis present

## 2019-10-28 DIAGNOSIS — Z9981 Dependence on supplemental oxygen: Secondary | ICD-10-CM

## 2019-10-28 DIAGNOSIS — J440 Chronic obstructive pulmonary disease with acute lower respiratory infection: Secondary | ICD-10-CM | POA: Diagnosis present

## 2019-10-28 LAB — COMPREHENSIVE METABOLIC PANEL
ALT: 22 U/L (ref 0–44)
AST: 33 U/L (ref 15–41)
Albumin: 2.6 g/dL — ABNORMAL LOW (ref 3.5–5.0)
Alkaline Phosphatase: 34 U/L — ABNORMAL LOW (ref 38–126)
Anion gap: 14 (ref 5–15)
BUN: 41 mg/dL — ABNORMAL HIGH (ref 8–23)
CO2: 38 mmol/L — ABNORMAL HIGH (ref 22–32)
Calcium: 9.3 mg/dL (ref 8.9–10.3)
Chloride: 90 mmol/L — ABNORMAL LOW (ref 98–111)
Creatinine, Ser: 1.25 mg/dL — ABNORMAL HIGH (ref 0.61–1.24)
GFR calc Af Amer: >60 mL/min (ref >60)
GFR calc non Af Amer: 54 mL/min — ABNORMAL LOW (ref >60)
Glucose, Bld: 157 mg/dL — ABNORMAL HIGH (ref 70–99)
Potassium: 4.6 mmol/L (ref 3.5–5.1)
Sodium: 142 mmol/L (ref 135–145)
Total Bilirubin: 0.4 mg/dL (ref 0.3–1.2)
Total Protein: 6.7 g/dL (ref 6.5–8.1)

## 2019-10-28 LAB — PROCALCITONIN: Procalcitonin: <0.1 ng/mL

## 2019-10-28 LAB — CBC WITH DIFFERENTIAL/PLATELET
Abs Immature Granulocytes: 0.1 10*3/uL — ABNORMAL HIGH (ref 0.00–0.07)
Basophils Absolute: 0 10*3/uL (ref 0.0–0.1)
Basophils Relative: 0 %
Eosinophils Absolute: 0 10*3/uL (ref 0.0–0.5)
Eosinophils Relative: 0 %
HCT: 37.4 % — ABNORMAL LOW (ref 39.0–52.0)
Hemoglobin: 11.4 g/dL — ABNORMAL LOW (ref 13.0–17.0)
Immature Granulocytes: 1 %
Lymphocytes Relative: 7 %
Lymphs Abs: 0.6 10*3/uL — ABNORMAL LOW (ref 0.7–4.0)
MCH: 32.2 pg (ref 26.0–34.0)
MCHC: 30.5 g/dL (ref 30.0–36.0)
MCV: 105.6 fL — ABNORMAL HIGH (ref 80.0–100.0)
Monocytes Absolute: 0.4 10*3/uL (ref 0.1–1.0)
Monocytes Relative: 4 %
Neutro Abs: 7.3 10*3/uL (ref 1.7–7.7)
Neutrophils Relative %: 88 %
Platelets: 400 10*3/uL (ref 150–400)
RBC: 3.54 MIL/uL — ABNORMAL LOW (ref 4.22–5.81)
RDW: 13.2 % (ref 11.5–15.5)
WBC: 8.4 10*3/uL (ref 4.0–10.5)
nRBC: 0.8 % — ABNORMAL HIGH (ref 0.0–0.2)

## 2019-10-28 LAB — TYPE AND SCREEN
ABO/RH(D): O POS
Antibody Screen: NEGATIVE

## 2019-10-28 LAB — BRAIN NATRIURETIC PEPTIDE: B Natriuretic Peptide: 102.4 pg/mL — ABNORMAL HIGH (ref 0.0–100.0)

## 2019-10-28 LAB — HEMOGLOBIN A1C
Hgb A1c MFr Bld: 5.9 % — ABNORMAL HIGH (ref 4.8–5.6)
Mean Plasma Glucose: 122.63 mg/dL

## 2019-10-28 LAB — C-REACTIVE PROTEIN: CRP: 10.8 mg/dL — ABNORMAL HIGH (ref <1.0)

## 2019-10-28 LAB — D-DIMER, QUANTITATIVE: D-Dimer, Quant: 0.58 ug/mL-FEU — ABNORMAL HIGH (ref 0.00–0.50)

## 2019-10-28 LAB — FERRITIN: Ferritin: 147 ng/mL (ref 24–336)

## 2019-10-28 LAB — GLUCOSE, CAPILLARY
Glucose-Capillary: 143 mg/dL — ABNORMAL HIGH (ref 70–99)
Glucose-Capillary: 180 mg/dL — ABNORMAL HIGH (ref 70–99)

## 2019-10-28 MED ORDER — SODIUM CHLORIDE 0.9 % IV SOLN: 100.0000 mg | Freq: Every day | INTRAVENOUS | Status: DC

## 2019-10-28 MED ORDER — SODIUM CHLORIDE 0.9 % IV SOLN: 250.0000 mL | INTRAVENOUS | Status: DC | PRN

## 2019-10-28 MED ORDER — POLYETHYLENE GLYCOL 3350 17 G PO PACK: 17.0000 g | PACK | Freq: Every day | ORAL | Status: DC | PRN

## 2019-10-28 MED ORDER — METHYLPREDNISOLONE SODIUM SUCC 40 MG IJ SOLR
40.0000 mg | Freq: Two times a day (BID) | INTRAMUSCULAR | Status: DC
  Administered 2019-10-28 – 2019-10-29 (×3): 40 mg via INTRAVENOUS
  Filled 2019-10-28 (×3): qty 1

## 2019-10-28 MED ORDER — SODIUM CHLORIDE 0.9% FLUSH
3.0000 mL | Freq: Two times a day (BID) | INTRAVENOUS | Status: DC
  Administered 2019-10-28 – 2019-10-29 (×3): 3 mL via INTRAVENOUS

## 2019-10-28 MED ORDER — ONDANSETRON HCL 4 MG/2ML IJ SOLN: 4.0000 mg | Freq: Four times a day (QID) | INTRAMUSCULAR | Status: DC | PRN

## 2019-10-28 MED ORDER — ASCORBIC ACID 500 MG PO TABS
500.0000 mg | ORAL_TABLET | Freq: Every day | ORAL | Status: DC
  Administered 2019-10-28 – 2019-11-01 (×5): 500 mg via ORAL
  Filled 2019-10-28 (×5): qty 1

## 2019-10-28 MED ORDER — ALUM & MAG HYDROXIDE-SIMETH 200-200-20 MG/5ML PO SUSP
30.0000 mL | Freq: Four times a day (QID) | ORAL | Status: DC | PRN
  Administered 2019-10-28: 30 mL via ORAL
  Filled 2019-10-28: qty 30

## 2019-10-28 MED ORDER — ACETAMINOPHEN 325 MG PO TABS
650.0000 mg | ORAL_TABLET | Freq: Four times a day (QID) | ORAL | Status: DC | PRN
  Administered 2019-10-31: 650 mg via ORAL
  Filled 2019-10-28: qty 2

## 2019-10-28 MED ORDER — SODIUM CHLORIDE 0.9% FLUSH: 3.0000 mL | INTRAVENOUS | Status: DC | PRN

## 2019-10-28 MED ORDER — SODIUM CHLORIDE 0.9 % IV SOLN
100.0000 mg | Freq: Every day | INTRAVENOUS | Status: AC
  Administered 2019-10-28 – 2019-10-31 (×4): 100 mg via INTRAVENOUS
  Filled 2019-10-28 (×4): qty 20

## 2019-10-28 MED ORDER — GUAIFENESIN-DM 100-10 MG/5ML PO SYRP: 10.0000 mL | ORAL_SOLUTION | ORAL | Status: DC | PRN

## 2019-10-28 MED ORDER — INSULIN ASPART 100 UNIT/ML ~~LOC~~ SOLN
0.0000 [IU] | Freq: Three times a day (TID) | SUBCUTANEOUS | Status: DC
  Administered 2019-10-28: 2 [IU] via SUBCUTANEOUS
  Administered 2019-10-29 (×2): 1 [IU] via SUBCUTANEOUS

## 2019-10-28 MED ORDER — IPRATROPIUM-ALBUTEROL 20-100 MCG/ACT IN AERS
1.0000 | INHALATION_SPRAY | Freq: Four times a day (QID) | RESPIRATORY_TRACT | Status: DC
  Administered 2019-10-28 – 2019-11-01 (×15): 1 via RESPIRATORY_TRACT
  Filled 2019-10-28: qty 4

## 2019-10-28 MED ORDER — ENOXAPARIN SODIUM 40 MG/0.4ML ~~LOC~~ SOLN
40.0000 mg | SUBCUTANEOUS | Status: DC
  Administered 2019-10-28 – 2019-10-31 (×4): 40 mg via SUBCUTANEOUS
  Filled 2019-10-28 (×4): qty 0.4

## 2019-10-28 MED ORDER — SODIUM CHLORIDE 0.9 % IV SOLN: 200.0000 mg | Freq: Once | INTRAVENOUS | Status: DC

## 2019-10-28 MED ORDER — ONDANSETRON HCL 4 MG PO TABS: 4.0000 mg | ORAL_TABLET | Freq: Four times a day (QID) | ORAL | Status: DC | PRN

## 2019-10-28 MED ORDER — ZINC SULFATE 220 (50 ZN) MG PO CAPS
220.0000 mg | ORAL_CAPSULE | Freq: Every day | ORAL | Status: DC
  Administered 2019-10-28 – 2019-11-01 (×5): 220 mg via ORAL
  Filled 2019-10-28 (×5): qty 1

## 2019-10-28 MED ORDER — HYDROCOD POLST-CPM POLST ER 10-8 MG/5ML PO SUER: 5.0000 mL | Freq: Two times a day (BID) | ORAL | Status: DC | PRN

## 2019-10-28 NOTE — Plan of Care (Signed)
  Problem: Clinical Measurements: Goal: Will remain free from infection Outcome: Progressing   Problem: Clinical Measurements: Goal: Respiratory complications will improve Outcome: Progressing   Problem: Safety: Goal: Ability to remain free from injury will improve Outcome: Progressing   Problem: Skin Integrity: Goal: Risk for impaired skin integrity will decrease Outcome: Progressing   Problem: Coping: Goal: Psychosocial and spiritual needs will be supported Outcome: Progressing   Problem: Respiratory: Goal: Will maintain a patent airway Outcome: Progressing

## 2019-10-28 NOTE — H&P (Signed)
HISTORY AND PHYSICAL       PATIENT DETAILS Name: Mario Dawson Age: 79 y.o. Sex: male Date of Birth: Jul 31, 1941 Admit Date: 10/28/2019 QP:[id number], No Pcp Per   Patient coming from: [facility]   CHIEF COMPLAINT:  SOB  HPI: Mario Dawson is a 79 y.o. male with medical history significant of COPD on home O2-3-4 L, HLD, chronic diastolic heart failure, prostate cancer who was transferred to [facility] from [place] for evaluation and treatment of acute on chronic hypoxic respiratory failure secondary to COVID-19 pneumonia.  Per patient, his spouse was recently diagnosed with Covid last week-for the past 2-3 days he has started feeling weak.  He also has associated cough and chest congestion.  He now has developed worsening shortness of breath.  EMS was called, he apparently was down to 74% on room air (apparently was not on his home O2)-and was placed on 15 L NRB while in route to the hospital.  In the ED he was provided with supportive care-and then subsequently transition to 4 L of oxygen.  Per documentation from [facility]-work-up in the ED demonstrated chest x-ray with typical Covid changes-and COVID-19 PCR was positive.  He was given steroids, remdesivir and 1 dose of Actemra-he was also placed on empiric Rocephin and Zithromax.  He was then transferred to [facility] for further evaluation and treatment.  Denies any shortness of breath at rest-denies any chest pain.  There is no history of nausea, vomiting or diarrhea.  Note: Lives at: Home Mobility:  Independent Chronic Indwelling Foley:no   REVIEW OF SYSTEMS:  Constitutional:   No  weight loss, night sweats.  HEENT:    No headaches, Dysphagia,Tooth/dental problems,Sore throat  Cardio-vascular: No chest pain,Orthopnea, PND,lower extremity edema, anasarca, palpitations  GI:  No heartburn, indigestion, abdominal pain, nausea, vomiting, diarrhea, melena or hematochezia  Resp: No  hemoptysis,plueritic chest pain.   Skin:    No rash or lesions.  GU:  No dysuria, change in color of urine, no urgency or frequency.  No flank pain.  Musculoskeletal: No joint pain or swelling.  No decreased range of motion.  No back pain.  Endocrine: No heat intolerance, no cold intolerance, no polyuria, no polydipsia  Psych: No change in mood or affect. No depression or anxiety.  No memory loss.   ALLERGIES:  Not on File  PAST MEDICAL HISTORY: COPD Chronic hypoxic respiratory failure on home O2 Dyslipidemia Prostate cancer  PAST SURGICAL HISTORY: Appendectomy  MEDICATIONS AT HOME: Prior to Admission medications   Not on File    FAMILY HISTORY: No family history of CAD  SOCIAL HISTORY:  has no history on file for tobacco, alcohol, and drug.  PHYSICAL EXAM: Blood pressure 130/82, pulse 83, resp. rate (!) 25, SpO2 92 %.  General appearance :Awake, alert, not in any distress.  Eyes:, pupils equally reactive to light and accomodation,no scleral icterus.Pink conjunctiva HEENT: Atraumatic and Normocephalic Neck: supple, no JVD.  Resp:Good air entry bilaterally, + scattered rhonchi CVS: S1 S2 regular, no murmurs.  GI: Bowel sounds present, Non tender and not distended with no gaurding, rigidity or rebound. Extremities: B/L Lower Ext shows no edema, both legs are warm to touch Neurology:  speech clear,Non focal, sensation is grossly intact. Psychiatric: Normal judgment and insight. Alert and oriented x 3. Normal mood. Musculoskeletal:gait appears to be normal.No digital cyanosis Skin:No Rash, warm and dry Wounds:N/A  LABS ON ADMISSION:  I have personally reviewed following labs and imaging studies  CBC: No  results for input(s): WBC, NEUTROABS, HGB, HCT, MCV, PLT in the last 168 hours.  Basic Metabolic Panel: No results for input(s): NA, K, CL, CO2, GLUCOSE, BUN, CREATININE, CALCIUM, MG, PHOS in the last 168 hours.  GFR: CrCl cannot be calculated (No successful lab value found.).  Liver Function  Tests: No results for input(s): AST, ALT, ALKPHOS, BILITOT, PROT, ALBUMIN in the last 168 hours. No results for input(s): LIPASE, AMYLASE in the last 168 hours. No results for input(s): AMMONIA in the last 168 hours.  Coagulation Profile: No results for input(s): INR, PROTIME in the last 168 hours.  Cardiac Enzymes: No results for input(s): CKTOTAL, CKMB, CKMBINDEX, TROPONINI in the last 168 hours.  BNP (last 3 results) No results for input(s): PROBNP in the last 8760 hours.  HbA1C: No results for input(s): HGBA1C in the last 72 hours.  CBG: No results for input(s): GLUCAP in the last 168 hours.  Lipid Profile: No results for input(s): CHOL, HDL, LDLCALC, TRIG, CHOLHDL, LDLDIRECT in the last 72 hours.  Thyroid Function Tests: No results for input(s): TSH, T4TOTAL, FREET4, T3FREE, THYROIDAB in the last 72 hours.  Anemia Panel: No results for input(s): VITAMINB12, FOLATE, FERRITIN, TIBC, IRON, RETICCTPCT in the last 72 hours.  Urine analysis: No results found for: COLORURINE, APPEARANCEUR, LABSPEC, PHURINE, GLUCOSEU, HGBUR, BILIRUBINUR, KETONESUR, PROTEINUR, UROBILINOGEN, NITRITE, LEUKOCYTESUR  Sepsis Labs: Lactic Acid, Venous No results found for: Mark   Microbiology: No results found for this or any previous visit (from the past 240 hour(s)).    RADIOLOGIC STUDIES ON ADMISSION: No results found.  I have personally reviewed images of chest xray-  EKG:  Personally reviewed.  Sinus rhythm-+ artifacts  ASSESSMENT AND PLAN: Acute on chronic hypoxic respiratory failure secondary to COVID-19 pneumonia: Seems to have improved compared to his initial presentation-while I was in the room-he was titrated down to 4 L of fusion (his baseline).  Plans are to continue with steroids and remdesivir.  COVID-19 medications Remdesivir: 1/27>> Steroids: 1/27>> Actemra: 1/28 x 1 at [facility]  Antibiotics: Rocephin: 1/27>> 1/28 Zithromax: 1/27>> 1/28  DVT  prophylaxis: Lovenox  Chronic diastolic heart failure: Did receive IV diuretics at [facility]-currently appears euvolemic.  Await labs-hold diuretics for now.  Reassess tomorrow.  COPD: Some scattered rhonchi but no overt exacerbation-continue bronchodilators.  Dyslipidemia: Hold statins-await pharmacy medication reconciliation before resuming.  History of prostate cancer: Stable for outpatient follow-up with oncology  Further plan will depend as patient's clinical course evolves and further radiologic and laboratory data become available. Patient will be monitored closely.  Above noted plan was discussed with patient at bedside and spouse over the phone  CONSULTS: None  DVT Prophylaxis: Prophylactic Lovenox  Code Status: DNR-discussed with patient-and have informed spouse over the phone on 1/28  Disposition Plan: Probably back to home with home health services depending on clinical progress.  Admission status: Inpatient  going to SDU  The medical decision making on this patient was of high complexity and the patient is at high risk for clinical deterioration, therefore this is a level 3 visit.   Total time spent  55 minutes.Greater than 50% of this time was spent in counseling, explanation of diagnosis, planning of further management, and coordination of care.  Severity of illness:  The appropriate patient status for this patient is INPATIENT. Inpatient status is judged to be reasonable and necessary in order to provide the required intensity of service to ensure the patient's safety. The patient's presenting symptoms, physical exam findings, and initial  radiographic and laboratory data in the context of their chronic comorbidities is felt to place them at high risk for further clinical deterioration. Furthermore, it is not anticipated that the patient will be medically stable for discharge from the hospital within 2 midnights of admission. The following factors support the  patient status of inpatient.   " The patient's presenting symptoms include cough, shortness of breath " The worrisome physical exam findings include hypoxia. " The initial radiographic and laboratory data are worrisome because of pneumonia on x-ray, Covid positive. " The chronic co-morbidities include COPD on home O2  * I certify that at the point of admission it is my clinical judgment that the patient will require inpatient hospital care spanning beyond 2 midnights from the point of admission due to high intensity of service, high risk for further deterioration and high frequency of surveillance required.  Oren Binet [facility] Pager [phone]  If 7PM-7AM, please contact night-coverage  Please page via [email or web address]  Go to [email or web address] and use [facility]'s universal password to access. If you do not have the password, please contact the hospital operator.  Locate the [facility] provider you are looking for under [facility] and page to a number that you can be directly reached. If you still have difficulty reaching the provider, please page the [facility] (Director on Call) for the Hospitalists listed on amion for assistance.  10/28/2019, 4:13 PM

## 2019-10-28 NOTE — Progress Notes (Signed)
Per discussion with [facility] Pharmacist, patient received the following medications: 10/27/19: Remdesivir 200mg  IV at 23:47, Lovenox 40mg  SQ at 23:50 10/28/19: Dexamethasone 6mg  IV at 09:15, Actemra 600mg  IV at 09:14  Peggyann Juba, PharmD, BCPS 10/28/2019 4:19 PM

## 2019-10-28 NOTE — Plan of Care (Signed)
  Problem: Nutrition: Goal: Adequate nutrition will be maintained Outcome: Progressing   Problem: Safety: Goal: Ability to remain free from injury will improve Outcome: Progressing   Problem: Skin Integrity: Goal: Risk for impaired skin integrity will decrease Outcome: Progressing   

## 2019-10-29 DIAGNOSIS — U071 COVID-19: Principal | ICD-10-CM

## 2019-10-29 LAB — CBC WITH DIFFERENTIAL/PLATELET
Abs Immature Granulocytes: 0.13 10*3/uL — ABNORMAL HIGH (ref 0.00–0.07)
Basophils Absolute: 0 10*3/uL (ref 0.0–0.1)
Basophils Relative: 0 %
Eosinophils Absolute: 0 10*3/uL (ref 0.0–0.5)
Eosinophils Relative: 0 %
HCT: 36.6 % — ABNORMAL LOW (ref 39.0–52.0)
Hemoglobin: 11.3 g/dL — ABNORMAL LOW (ref 13.0–17.0)
Immature Granulocytes: 1 %
Lymphocytes Relative: 9 %
Lymphs Abs: 1 10*3/uL (ref 0.7–4.0)
MCH: 32.7 pg (ref 26.0–34.0)
MCHC: 30.9 g/dL (ref 30.0–36.0)
MCV: 105.8 fL — ABNORMAL HIGH (ref 80.0–100.0)
Monocytes Absolute: 0.2 10*3/uL (ref 0.1–1.0)
Monocytes Relative: 2 %
Neutro Abs: 9.1 10*3/uL — ABNORMAL HIGH (ref 1.7–7.7)
Neutrophils Relative %: 88 %
Platelets: 375 10*3/uL (ref 150–400)
RBC: 3.46 MIL/uL — ABNORMAL LOW (ref 4.22–5.81)
RDW: 13.2 % (ref 11.5–15.5)
WBC: 10.5 10*3/uL (ref 4.0–10.5)
nRBC: 1 % — ABNORMAL HIGH (ref 0.0–0.2)

## 2019-10-29 LAB — GLUCOSE, CAPILLARY
Glucose-Capillary: 124 mg/dL — ABNORMAL HIGH (ref 70–99)
Glucose-Capillary: 122 mg/dL — ABNORMAL HIGH (ref 70–99)
Glucose-Capillary: 96 mg/dL (ref 70–99)
Glucose-Capillary: 155 mg/dL — ABNORMAL HIGH (ref 70–99)

## 2019-10-29 LAB — ABO/RH: ABO/RH(D): O POS

## 2019-10-29 LAB — COMPREHENSIVE METABOLIC PANEL
ALT: 20 U/L (ref 0–44)
AST: 30 U/L (ref 15–41)
Albumin: 2.6 g/dL — ABNORMAL LOW (ref 3.5–5.0)
Alkaline Phosphatase: 36 U/L — ABNORMAL LOW (ref 38–126)
Anion gap: 9 (ref 5–15)
BUN: 42 mg/dL — ABNORMAL HIGH (ref 8–23)
CO2: 39 mmol/L — ABNORMAL HIGH (ref 22–32)
Calcium: 9.3 mg/dL (ref 8.9–10.3)
Chloride: 92 mmol/L — ABNORMAL LOW (ref 98–111)
Creatinine, Ser: 1.17 mg/dL (ref 0.61–1.24)
GFR calc Af Amer: >60 mL/min (ref >60)
GFR calc non Af Amer: 59 mL/min — ABNORMAL LOW (ref >60)
Glucose, Bld: 131 mg/dL — ABNORMAL HIGH (ref 70–99)
Potassium: 4.6 mmol/L (ref 3.5–5.1)
Sodium: 140 mmol/L (ref 135–145)
Total Bilirubin: 0.6 mg/dL (ref 0.3–1.2)
Total Protein: 6.5 g/dL (ref 6.5–8.1)

## 2019-10-29 LAB — C-REACTIVE PROTEIN: CRP: 8.1 mg/dL — ABNORMAL HIGH (ref <1.0)

## 2019-10-29 LAB — FERRITIN: Ferritin: 135 ng/mL (ref 24–336)

## 2019-10-29 LAB — D-DIMER, QUANTITATIVE: D-Dimer, Quant: 0.58 ug/mL-FEU — ABNORMAL HIGH (ref 0.00–0.50)

## 2019-10-29 MED ORDER — FLUTICASONE FUROATE-VILANTEROL 100-25 MCG/INH IN AEPB
1.0000 | INHALATION_SPRAY | Freq: Every day | RESPIRATORY_TRACT | Status: DC
  Administered 2019-10-29 – 2019-11-01 (×4): 1 via RESPIRATORY_TRACT
  Filled 2019-10-29: qty 28

## 2019-10-29 MED ORDER — FLUTICASONE-UMECLIDIN-VILANT 100-62.5-25 MCG/INH IN AEPB: 1.0000 | INHALATION_SPRAY | Freq: Every day | RESPIRATORY_TRACT | Status: DC

## 2019-10-29 MED ORDER — FUROSEMIDE 20 MG PO TABS
20.0000 mg | ORAL_TABLET | Freq: Every day | ORAL | Status: DC
  Administered 2019-10-29 – 2019-11-01 (×3): 20 mg via ORAL
  Filled 2019-10-29 (×4): qty 1

## 2019-10-29 MED ORDER — FERROUS SULFATE 325 (65 FE) MG PO TABS
325.0000 mg | ORAL_TABLET | Freq: Every day | ORAL | Status: DC
  Administered 2019-10-30 – 2019-11-01 (×3): 325 mg via ORAL
  Filled 2019-10-29 (×3): qty 1

## 2019-10-29 MED ORDER — FLUTICASONE PROPIONATE 50 MCG/ACT NA SUSP
1.0000 | Freq: Every day | NASAL | Status: DC
  Administered 2019-10-29 – 2019-11-01 (×3): 1 via NASAL
  Filled 2019-10-29: qty 16

## 2019-10-29 MED ORDER — ASPIRIN EC 81 MG PO TBEC
81.0000 mg | DELAYED_RELEASE_TABLET | Freq: Every day | ORAL | Status: DC
  Administered 2019-10-29 – 2019-11-01 (×4): 81 mg via ORAL
  Filled 2019-10-29 (×4): qty 1

## 2019-10-29 MED ORDER — UMECLIDINIUM BROMIDE 62.5 MCG/INH IN AEPB
1.0000 | INHALATION_SPRAY | Freq: Every day | RESPIRATORY_TRACT | Status: DC
  Administered 2019-10-29 – 2019-11-01 (×4): 1 via RESPIRATORY_TRACT
  Filled 2019-10-29: qty 7

## 2019-10-29 MED ORDER — PRAVASTATIN SODIUM 10 MG PO TABS
10.0000 mg | ORAL_TABLET | Freq: Every day | ORAL | Status: DC
  Administered 2019-10-29 – 2019-10-31 (×3): 10 mg via ORAL
  Filled 2019-10-29 (×3): qty 1

## 2019-10-29 MED ORDER — CHOLECALCIFEROL 10 MCG (400 UNIT) PO TABS
400.0000 [IU] | ORAL_TABLET | Freq: Every day | ORAL | Status: DC
  Administered 2019-10-29 – 2019-11-01 (×4): 400 [IU] via ORAL
  Filled 2019-10-29 (×4): qty 1

## 2019-10-29 MED ORDER — LATANOPROST 0.005 % OP SOLN
1.0000 [drp] | Freq: Every day | OPHTHALMIC | Status: DC
  Administered 2019-10-29 – 2019-10-30 (×2): 1 [drp] via OPHTHALMIC
  Filled 2019-10-29: qty 2.5

## 2019-10-29 NOTE — Progress Notes (Signed)
Occupational Therapy Evaluation Patient Details Name: Mario Dawson MRN: [id number] DOB: 10/12/1940 Today's Date: 10/29/2019    History of Present Illness 79 y.o. male with medical history significant of COPD on home O2-3-4 L, HLD, chronic diastolic heart failure, prostate cancer who was transferred to [facility] from [place] for evaluation and treatment of acute on chronic hypoxic respiratory failure secondary to COVID-19 pneumonia.   Clinical Impression   Patient was pleasant and happy to participate.  He lives at home with wife and is usually needs assistance with bathing/LE dressing.  Has niece that helps them regularly, as well as an aid 3x/week.  Patient is on 3-4L O2 at baseline for COPD.  Patient on 6L and SpO2 at 95 in bed at start and SpO2 did not fall below 90 with mobility.  Patient is oriented but is impulsive and has processing is slow, needing clear one step directions.  He required min/mod assist with mobility and standing ADLs as he was unsteady even with walker.  Educated patient on sitting up in chair and pursed lip breathing.  Completed flutter valve and incentive spirometer.  Will continue to follow with OT acutely    Follow Up Recommendations  Home health OT;Supervision/Assistance - 24 hour    Equipment Recommendations  Other (comment)(Rolling walker)    Recommendations for Other Services       Precautions / Restrictions Precautions Precautions: Fall Precaution Comments: Impulsive Restrictions Weight Bearing Restrictions: No      Mobility Bed Mobility Overal bed mobility: Modified Independent                Transfers Overall transfer level: Needs assistance Equipment used: Rolling walker (2 wheeled) Transfers: Sit to/from [facility] Sit to Stand: Min assist Stand pivot transfers: Mod assist       General transfer comment: Needs verbal cueing and reinforcement for sequencing    Balance Overall balance assessment: Needs  assistance Sitting-balance support: Feet supported Sitting balance-Leahy Scale: Good     Standing balance support: Bilateral upper extremity supported Standing balance-Leahy Scale: Poor Standing balance comment: Unsteady when standing with RW, needed therapist support                            ADL either performed or assessed with clinical judgement   ADL Overall ADL's : Needs assistance/impaired Eating/Feeding: Set up;Sitting   Grooming: Supervision/safety;Sitting;Set up   Upper Body Bathing: Min guard;Sitting   Lower Body Bathing: Sitting/lateral leans;Minimal assistance   Upper Body Dressing : Set up;Sitting   Lower Body Dressing: Sit to/from stand;Moderate assistance   Toilet Transfer: Moderate assistance;Ambulation;BSC;RW   Toileting- Clothing Manipulation and Hygiene: Minimal assistance       Functional mobility during ADLs: Moderate assistance;Rolling walker General ADL Comments: He was unsteady with mobility and legs were a bit wobbly.     Vision         Perception     Praxis      Pertinent Vitals/Pain Pain Assessment: No/denies pain     Hand Dominance     Extremity/Trunk Assessment Upper Extremity Assessment Upper Extremity Assessment: Generalized weakness           Communication Communication Communication: HOH   Cognition Arousal/Alertness: Awake/alert Behavior During Therapy: Impulsive;WFL for tasks assessed/performed Overall Cognitive Status: No family/caregiver present to determine baseline cognitive functioning  General Comments: Slow processing   General Comments       Exercises Exercises: Other exercises Other Exercises Other Exercises: Pursed lip breathing Other Exercises: x10 flutter valve Other Exercises: x10 incentive spirometer   Shoulder Instructions      Home Living Family/patient expects to be discharged to:: Private residence Living Arrangements:  Spouse/significant other Available Help at Discharge: Family;Friend(s) Type of Home: House Home Access: Stairs to enter [employer] of Steps: 5 Entrance Stairs-Rails: Right Home Layout: Two level;Able to live on main level with bedroom/bathroom     Bathroom Shower/Tub: Teacher, early years/pre: Standard     Home Equipment: Shower seat          Prior Functioning/Environment Level of Independence: Needs assistance    ADL's / Homemaking Assistance Needed: Has a neice that helps whenever needed.  He mentioned he has someone, either an aid or a therapist that comes 3xweek to help with bathing            OT Problem List: Decreased strength;Decreased activity tolerance;Impaired balance (sitting and/or standing);Decreased safety awareness;Cardiopulmonary status limiting activity      OT Treatment/Interventions: Self-care/ADL training;Therapeutic exercise;Energy conservation;Therapeutic activities;Balance training;Patient/family education    OT Goals(Current goals can be found in the care plan section) Acute Rehab OT Goals Patient Stated Goal: To go home to family OT Goal Formulation: With patient Time For Goal Achievement: 11/12/19 Potential to Achieve Goals: Good  OT Frequency: Min 3X/week   Barriers to D/C:            Co-evaluation              AM-PAC OT "6 Clicks" Daily Activity     Outcome Measure Help from another person eating meals?: None Help from another person taking care of personal grooming?: A Little Help from another person toileting, which includes using toliet, bedpan, or urinal?: A Lot Help from another person bathing (including washing, rinsing, drying)?: A Lot Help from another person to put on and taking off regular upper body clothing?: A Little Help from another person to put on and taking off regular lower body clothing?: A Lot 6 Click Score: 16   End of Session Equipment Utilized During Treatment: Rolling  walker;Oxygen Nurse Communication: Mobility status  Activity Tolerance: Patient limited by fatigue Patient left: in chair;with call bell/phone within reach;with chair alarm set  OT Visit Diagnosis: Unsteadiness on feet (R26.81);Muscle weakness (generalized) (M62.81);Other symptoms and signs involving cognitive function                Time: 1033-1130 OT Time Calculation (min): 57 min Charges:  OT General Charges $OT Visit: 1 Visit OT Evaluation $OT Eval Moderate Complexity: 1 Mod OT Treatments $Self Care/Home Management : 38-52 mins  August Luz, OTR/L   Phylliss Bob 10/29/2019, 12:14 PM

## 2019-10-29 NOTE — Progress Notes (Addendum)
PROGRESS NOTE                                                                                                                                                                                                             Patient Demographics:    Mario Dawson, is a 79 y.o. male, DOB - 1941-09-01, [id number]  Outpatient Primary MD for the patient is Patient, No Pcp Per   Admit date - 10/28/2019   LOS - 1  No chief complaint on file.      Brief Narrative: HALDON BARLET is a 79 y.o. male with medical history significant of COPD on home O2-3-4 L, HLD, chronic diastolic heart failure, prostate cancer who was transferred to [facility] from [place] for evaluation and treatment of acute on chronic hypoxic respiratory failure secondary to COVID-19 pneumonia.  When he initially presented to [facility] ED-he was on 15 L NRB-but was subsequently transition to around 4-5 L of oxygen.    Subjective:    Jedd Burki today is back down to 4 L of oxygen-he feels better.   Assessment  & Plan :   Acute on chronic hypoxic Resp Failure due to Covid 19 Viral pneumonia: Improving-Down to 4 L of oxygen (his baseline).  Continue steroids and remdesivir-if clinical improvement continues-we can consider discharge over the next few days.  Await PT/OT eval.  Fever: afebrile  O2 requirements:  SpO2: 90 % O2 Flow Rate (L/min): 4 L/min   COVID-19 Labs: Recent Labs    10/28/19 1630 10/29/19 0207  DDIMER 0.58* 0.58*  FERRITIN 147 135  CRP 10.8* 8.1*       Component Value Date/Time   BNP 102.4 (H) 10/28/2019 1630    Recent Labs  Lab 10/28/19 1630  PROCALCITON <0.10    No results found for: SARSCOV2NAA   COVID-19 medications Remdesivir: 1/27>> Steroids: 1/27>> Actemra: 1/28 x 1 at [facility]  Antibiotics: Rocephin: 1/27>> 1/28 Zithromax: 1/27>> 1/28  DVT prophylaxis: Lovenox  Prone/Incentive Spirometry:  encouraged  incentive spirometry use 3-4/hour.  Chronic diastolic heart failure: Did receive IV diuretics at [facility].  Currently euvolemic-resume usual home regimen of Lasix-20 mg daily.  ?Dysphagia: Per RN yesterday-patient developed coughing/choking after eating regular consistency diet-he is missing his dentures-downgraded to dysphagia 2 diet-SLP eval placed.  HTN: Controlled-benazepril remains on hold.  COPD: Some scattered rhonchi but  no overt exacerbation-continue bronchodilators.  Dyslipidemia: Resume statins  History of prostate cancer: Stable for outpatient follow-up with oncology  Consults  :  None  Procedures  :  None  ABG: No results found for: PHART, PCO2ART, PO2ART, HCO3, TCO2, ACIDBASEDEF, O2SAT  Vent Settings: N/A  Condition - Guarded  Family Communication  :  Spouse updated over the phone  Code Status :  DNR (rediscussed this with spouse today-she is aware of this-and concurs)  Diet :  Diet Order            DIET DYS 2 Room service appropriate? Yes; Fluid consistency: Thin  Diet effective now               Disposition Plan  :  Remain hospitalized-probably home with home health services over the next few days-depending on clinical progress.  Barriers to discharge: Hypoxia requiring O2 supplementation/complete 5 days of IV Remdesivir (risk factors for severe disease)  Antimicorbials  :    Anti-infectives (From admission, onward)   Start     Dose/Rate Route Frequency Ordered Stop   10/29/19 1000  remdesivir 100 mg in sodium chloride 0.9 % 100 mL IVPB  Status:  Discontinued     100 mg 200 mL/hr over 30 Minutes Intravenous Daily 10/28/19 1609 10/28/19 1617   10/28/19 1800  remdesivir 100 mg in sodium chloride 0.9 % 100 mL IVPB     100 mg 200 mL/hr over 30 Minutes Intravenous Daily 10/28/19 1617 11/01/19 0959   10/28/19 1615  remdesivir 200 mg in sodium chloride 0.9% 250 mL IVPB  Status:  Discontinued     200 mg 580 mL/hr over 30 Minutes  Intravenous Once 10/28/19 1609 10/28/19 1617      Inpatient Medications  Scheduled Meds: . vitamin C  500 mg Oral Daily  . enoxaparin (LOVENOX) injection  40 mg Subcutaneous Q24H  . insulin aspart  0-9 Units Subcutaneous TID WC  . Ipratropium-Albuterol  1 puff Inhalation Q6H  . methylPREDNISolone (SOLU-MEDROL) injection  40 mg Intravenous Q12H  . sodium chloride flush  3 mL Intravenous Q12H  . zinc sulfate  220 mg Oral Daily   Continuous Infusions: . sodium chloride    . remdesivir 100 mg in NS 100 mL 100 mg (10/29/19 1056)   PRN Meds:.sodium chloride, acetaminophen, alum & mag hydroxide-simeth, chlorpheniramine-HYDROcodone, guaiFENesin-dextromethorphan, ondansetron **OR** ondansetron (ZOFRAN) IV, polyethylene glycol, sodium chloride flush   Time Spent in minutes  25  See all Orders from today for further details   Oren Binet M.D on 10/29/2019 at 11:41 AM  To page go to [email or web address] - use universal password  [facility] -  Office  [phone]    Objective:   Vitals:   10/29/19 0253 10/29/19 0300 10/29/19 0400 10/29/19 0726  BP: 133/70   133/79  Pulse: 77  76 79  Resp: 20 15 (!) 21 18  Temp: 98.4 F (36.9 C)   98.1 F (36.7 C)  TempSrc: Oral   Oral  SpO2:  92% 95% 90%    Wt Readings from Last 3 Encounters:  No data found for Wt     Intake/Output Summary (Last 24 hours) at 10/29/2019 1141 Last data filed at 10/29/2019 0600 Gross per 24 hour  Intake 418.38 ml  Output 200 ml  Net 218.38 ml     Physical Exam Gen Exam:Alert awake-not in any distress HEENT:atraumatic, normocephalic Chest: B/L clear to auscultation anteriorly CVS:S1S2 regular Abdomen:soft non tender, non distended Extremities:no edema Neurology: Non focal Skin: no rash  Data Review:    CBC Recent Labs  Lab 10/28/19 1630 10/29/19 0207  WBC 8.4 10.5  HGB 11.4* 11.3*  HCT 37.4* 36.6*  PLT 400 375  MCV 105.6* 105.8*  MCH 32.2 32.7  MCHC 30.5 30.9  RDW 13.2 13.2    LYMPHSABS 0.6* 1.0  MONOABS 0.4 0.2  EOSABS 0.0 0.0  BASOSABS 0.0 0.0    Chemistries  Recent Labs  Lab 10/28/19 1630 10/29/19 0207  NA 142 140  K 4.6 4.6  CL 90* 92*  CO2 38* 39*  GLUCOSE 157* 131*  BUN 41* 42*  CREATININE 1.25* 1.17  CALCIUM 9.3 9.3  AST 33 30  ALT 22 20  ALKPHOS 34* 36*  BILITOT 0.4 0.6   ------------------------------------------------------------------------------------------------------------------ No results for input(s): CHOL, HDL, LDLCALC, TRIG, CHOLHDL, LDLDIRECT in the last 72 hours.  Lab Results  Component Value Date   HGBA1C 5.9 (H) 10/28/2019   ------------------------------------------------------------------------------------------------------------------ No results for input(s): TSH, T4TOTAL, T3FREE, THYROIDAB in the last 72 hours.  Invalid input(s): FREET3 ------------------------------------------------------------------------------------------------------------------ Recent Labs    10/28/19 1630 10/29/19 0207  FERRITIN 147 135    Coagulation profile No results for input(s): INR, PROTIME in the last 168 hours.  Recent Labs    10/28/19 1630 10/29/19 0207  DDIMER 0.58* 0.58*    Cardiac Enzymes No results for input(s): CKMB, TROPONINI, MYOGLOBIN in the last 168 hours.  Invalid input(s): CK ------------------------------------------------------------------------------------------------------------------    Component Value Date/Time   BNP 102.4 (H) 10/28/2019 1630    Micro Results No results found for this or any previous visit (from the past 240 hour(s)).  Radiology Reports DG Chest Port 1V same Day  Result Date: 10/28/2019 CLINICAL DATA:  79 year old male with shortness of breath EXAM: PORTABLE CHEST 1 VIEW COMPARISON:  10/27/2019 FINDINGS: Cardiomediastinal silhouette unchanged in size and contour with cardiomegaly. Low lung volumes with asymmetric elevation the right hemidiaphragm persists. No pneumothorax.  Reticulonodular opacities of the bilateral lungs, worse on the left. Overall there is slight improvement compared to the prior plain film. No displaced fracture IMPRESSION: Persisting reticulonodular opacities of the bilateral lungs worst on the left, compatible with multifocal pneumonia. Electronically Signed   By: Corrie Mckusick D.O.   On: 10/28/2019 16:38

## 2019-10-29 NOTE — Progress Notes (Signed)
Pt daughter called, Crue Traugh, contact info [phone].

## 2019-10-29 NOTE — Evaluation (Signed)
Physical Therapy Evaluation Patient Details Name: Mario Dawson MRN: [id number] DOB: June 05, 1941 Today's Date: 10/29/2019   History of Present Illness  79 y.o. male with medical history significant of COPD on home O2-3-4 L, HLD, chronic diastolic heart failure, prostate cancer who was transferred to [facility] from [place] for evaluation and treatment of acute on chronic hypoxic respiratory failure secondary to COVID-19 pneumonia.  Clinical Impression  The patient received on 5 L HFNC at 96%. Patient is unsteady with ambulation, requires steady assistance using RW  in tight spaces and turns in room.  Patient ambulated on 6 L with SPO2 94%/. Briefly on  Ra with quick drop to 75%. Recovered in 2 minutes with 8 L. placed on 5 after return to bed, SPO2 96%. Pt admitted with above diagnosis. *Pt currently with functional limitations due to the deficits listed below (see PT Problem List). Pt will benefit from skilled PT to increase their independence and safety with mobility to allow discharge to the venue listed below.   Plans to return home with family assisting.     Follow Up Recommendations Home health PT;Supervision/Assistance - 24 hour    Equipment Recommendations  (may benefit from rollator , may be able to use wife's RW)    Recommendations for Other Services       Precautions / Restrictions Precautions Precautions: Fall Precaution Comments: Impulsive, needs O2      Mobility  Bed Mobility Overal bed mobility: Modified Independent                Transfers Overall transfer level: Needs assistance Equipment used: Rolling walker (2 wheeled) Transfers: Sit to/from Stand Sit to Stand: Min assist         General transfer comment: steady assist from low surfaces, cues for hand use  Ambulation/Gait Ambulation/Gait assistance: Min assist Gait Distance (Feet): 20 Feet(then 40) Assistive device: Rolling walker (2 wheeled) Gait Pattern/deviations: Step-to pattern;Step-through  pattern;Staggering left;Staggering right;Drifts right/left Gait velocity: decr Gait velocity interpretation: <1.8 ft/sec, indicate of risk for recurrent falls General Gait Details: multimodal cues for safety and around objucts  Stairs            Wheelchair Mobility    Modified Rankin (Stroke Patients Only)       Balance Overall balance assessment: Needs assistance Sitting-balance support: Feet supported Sitting balance-Leahy Scale: Good     Standing balance support: Bilateral upper extremity supported Standing balance-Leahy Scale: Poor Standing balance comment: Unsteady when standing with RW, needed therapist support                              Pertinent Vitals/Pain Pain Assessment: No/denies pain    Home Living Family/patient expects to be discharged to:: Private residence Living Arrangements: Spouse/significant other;Other relatives Available Help at Discharge: Family;Friend(s) Type of Home: House Home Access: Stairs to enter Entrance Stairs-Rails: Right Entrance Stairs-Number of Steps: 5 Home Layout: Two level;Able to live on main level with bedroom/bathroom Home Equipment: Shower seat;Walker - 2 wheels      Prior Function Level of Independence: Needs assistance   Gait / Transfers Assistance Needed: still drives per pt, to dollar store.  ADL's / Homemaking Assistance Needed: Has a neice that helps whenever needed.  He mentioned he has someone, either an aid or a therapist that comes 3xweek to help with bathing        Hand Dominance        Extremity/Trunk Assessment   Upper Extremity Assessment  Upper Extremity Assessment: Generalized weakness    Lower Extremity Assessment Lower Extremity Assessment: Generalized weakness    Cervical / Trunk Assessment Cervical / Trunk Assessment: Normal  Communication      Cognition Arousal/Alertness: Awake/alert Behavior During Therapy: Impulsive;WFL for tasks assessed/performed Overall  Cognitive Status: No family/caregiver present to determine baseline cognitive functioning                                 General Comments: Slow processing      General Comments      Exercises Other Exercises Other Exercises: x10 incentive spirometer   Assessment/Plan    PT Assessment Patient needs continued PT services  PT Problem List Decreased strength;Decreased mobility;Decreased safety awareness;Decreased knowledge of precautions;Decreased activity tolerance;Cardiopulmonary status limiting activity;Decreased balance       PT Treatment Interventions      PT Goals (Current goals can be found in the Care Plan section)  Acute Rehab PT Goals Patient Stated Goal: To go home to family PT Goal Formulation: With patient Time For Goal Achievement: 11/12/19 Potential to Achieve Goals: Good    Frequency Min 3X/week   Barriers to discharge        Co-evaluation               AM-PAC PT "6 Clicks" Mobility  Outcome Measure Help needed turning from your back to your side while in a flat bed without using bedrails?: None Help needed moving from lying on your back to sitting on the side of a flat bed without using bedrails?: None Help needed moving to and from a bed to a chair (including a wheelchair)?: A Little Help needed standing up from a chair using your arms (e.g., wheelchair or bedside chair)?: A Little Help needed to walk in hospital room?: A Lot Help needed climbing 3-5 steps with a railing? : A Lot 6 Click Score: 18    End of Session Equipment Utilized During Treatment: Gait belt;Oxygen Activity Tolerance: Patient limited by fatigue;Treatment limited secondary to medical complications (Comment) Patient left: in bed;with bed alarm set;with call bell/phone within reach Nurse Communication: Mobility status PT Visit Diagnosis: Unsteadiness on feet (R26.81);Difficulty in walking, not elsewhere classified (R26.2);Muscle weakness (generalized) (M62.81)     Time: [id number] PT Time Calculation (min) (ACUTE ONLY): 50 min   Charges:   PT Evaluation $PT Eval Moderate Complexity: 1 Mod PT Treatments $Gait Training: 23-37 mins        Tresa Endo PT Acute Rehabilitation Services Pager [phone] Office [phone]   Claretha Cooper 10/29/2019, 4:22 PM

## 2019-10-30 DIAGNOSIS — J9601 Acute respiratory failure with hypoxia: Secondary | ICD-10-CM

## 2019-10-30 LAB — CBC WITH DIFFERENTIAL/PLATELET
Abs Immature Granulocytes: 0.14 10*3/uL — ABNORMAL HIGH (ref 0.00–0.07)
Basophils Absolute: 0 10*3/uL (ref 0.0–0.1)
Basophils Relative: 0 %
Eosinophils Absolute: 0 10*3/uL (ref 0.0–0.5)
Eosinophils Relative: 0 %
HCT: 37.6 % — ABNORMAL LOW (ref 39.0–52.0)
Hemoglobin: 11.3 g/dL — ABNORMAL LOW (ref 13.0–17.0)
Immature Granulocytes: 1 %
Lymphocytes Relative: 7 %
Lymphs Abs: 0.9 10*3/uL (ref 0.7–4.0)
MCH: 31.7 pg (ref 26.0–34.0)
MCHC: 30.1 g/dL (ref 30.0–36.0)
MCV: 105.6 fL — ABNORMAL HIGH (ref 80.0–100.0)
Monocytes Absolute: 0.3 10*3/uL (ref 0.1–1.0)
Monocytes Relative: 2 %
Neutro Abs: 11.3 10*3/uL — ABNORMAL HIGH (ref 1.7–7.7)
Neutrophils Relative %: 90 %
Platelets: 440 10*3/uL — ABNORMAL HIGH (ref 150–400)
RBC: 3.56 MIL/uL — ABNORMAL LOW (ref 4.22–5.81)
RDW: 13.2 % (ref 11.5–15.5)
WBC: 12.6 10*3/uL — ABNORMAL HIGH (ref 4.0–10.5)
nRBC: 0.8 % — ABNORMAL HIGH (ref 0.0–0.2)

## 2019-10-30 LAB — COMPREHENSIVE METABOLIC PANEL
ALT: 23 U/L (ref 0–44)
AST: 31 U/L (ref 15–41)
Albumin: 2.7 g/dL — ABNORMAL LOW (ref 3.5–5.0)
Alkaline Phosphatase: 39 U/L (ref 38–126)
Anion gap: 9 (ref 5–15)
BUN: 43 mg/dL — ABNORMAL HIGH (ref 8–23)
CO2: 36 mmol/L — ABNORMAL HIGH (ref 22–32)
Calcium: 9.1 mg/dL (ref 8.9–10.3)
Chloride: 93 mmol/L — ABNORMAL LOW (ref 98–111)
Creatinine, Ser: 1.17 mg/dL (ref 0.61–1.24)
GFR calc Af Amer: >60 mL/min (ref >60)
GFR calc non Af Amer: 59 mL/min — ABNORMAL LOW (ref >60)
Glucose, Bld: 216 mg/dL — ABNORMAL HIGH (ref 70–99)
Potassium: 4.5 mmol/L (ref 3.5–5.1)
Sodium: 138 mmol/L (ref 135–145)
Total Bilirubin: 0.6 mg/dL (ref 0.3–1.2)
Total Protein: 6.4 g/dL — ABNORMAL LOW (ref 6.5–8.1)

## 2019-10-30 LAB — GLUCOSE, CAPILLARY
Glucose-Capillary: 106 mg/dL — ABNORMAL HIGH (ref 70–99)
Glucose-Capillary: 160 mg/dL — ABNORMAL HIGH (ref 70–99)
Glucose-Capillary: 227 mg/dL — ABNORMAL HIGH (ref 70–99)
Glucose-Capillary: 171 mg/dL — ABNORMAL HIGH (ref 70–99)

## 2019-10-30 LAB — FERRITIN: Ferritin: 130 ng/mL (ref 24–336)

## 2019-10-30 LAB — C-REACTIVE PROTEIN: CRP: 4.2 mg/dL — ABNORMAL HIGH (ref <1.0)

## 2019-10-30 LAB — D-DIMER, QUANTITATIVE: D-Dimer, Quant: 0.57 ug/mL-FEU — ABNORMAL HIGH (ref 0.00–0.50)

## 2019-10-30 MED ORDER — INSULIN ASPART 100 UNIT/ML ~~LOC~~ SOLN
0.0000 [IU] | Freq: Three times a day (TID) | SUBCUTANEOUS | Status: DC
  Administered 2019-10-30: 3 [IU] via SUBCUTANEOUS
  Administered 2019-10-30 – 2019-10-31 (×3): 2 [IU] via SUBCUTANEOUS
  Administered 2019-11-01: 08:00:00 1 [IU] via SUBCUTANEOUS

## 2019-10-30 MED ORDER — LORAZEPAM 0.5 MG PO TABS
0.5000 mg | ORAL_TABLET | Freq: Once | ORAL | Status: AC | PRN
  Administered 2019-10-30: 0.5 mg via ORAL
  Filled 2019-10-30: qty 1

## 2019-10-30 MED ORDER — INSULIN REGULAR(HUMAN) IN NACL 100-0.9 UT/100ML-% IV SOLN: INTRAVENOUS | Status: DC

## 2019-10-30 MED ORDER — SODIUM CHLORIDE 0.9 % IV SOLN: INTRAVENOUS | Status: DC

## 2019-10-30 MED ORDER — DEXTROSE-NACL 5-0.45 % IV SOLN: INTRAVENOUS | Status: DC

## 2019-10-30 MED ORDER — INSULIN ASPART 100 UNIT/ML ~~LOC~~ SOLN: 0.0000 [IU] | Freq: Every day | SUBCUTANEOUS | Status: DC

## 2019-10-30 MED ORDER — DEXTROSE 50 % IV SOLN: 0.0000 mL | INTRAVENOUS | Status: DC | PRN

## 2019-10-30 MED ORDER — METHYLPREDNISOLONE SODIUM SUCC 40 MG IJ SOLR
40.0000 mg | Freq: Every day | INTRAMUSCULAR | Status: DC
  Administered 2019-10-30 – 2019-10-31 (×2): 40 mg via INTRAVENOUS
  Filled 2019-10-30 (×2): qty 1

## 2019-10-30 NOTE — Progress Notes (Signed)
Pt sat in recliner majority of day (chair alarm on, call bell & phone within reach.) Pt ate 75-100% of meals Remdesivir given IV solumedrol given Pt  Face-timed daughter this am.  Full linen change. SLP Eval today-see note for details.  Transferred to medsurg level of care today but confirmed w/ Elex Charge RN that pt is not on the list to transfer to 3rd floor today.

## 2019-10-30 NOTE — Progress Notes (Signed)
PROGRESS NOTE                                                                                                                                                                                                             Patient Demographics:    Mario Dawson, is a 79 y.o. male, DOB - [birth year], [id number]  Outpatient Primary MD for the patient is Patient, No Pcp Per   Admit date - 10/28/2019   LOS - 2  CC - SOB     Brief Narrative:  Mario Dawson is a 79 y.o. male with medical history significant of COPD on home O2-3-4 L, HLD, chronic diastolic heart failure, prostate cancer who was transferred to [facility] from [place] for evaluation and treatment of acute on chronic hypoxic respiratory failure secondary to COVID-19 pneumonia. When he initially presented to [facility] ED-he was on 15 L NRB-but was subsequently transition to around 4-5 L of oxygen.   Subjective:   Patient in bed, appears comfortable, denies any headache, no fever, no chest pain or pressure, improved shortness of breath , no abdominal pain. No focal weakness.   Assessment  & Plan :   Acute on chronic hypoxic Resp Failure due to Covid 19 Viral pneumonia: He had severe disease and was treated with combination of IV steroids, remdesivir and Actemra along with Rocephin and azithromycin at [place].  He has shown remarkable improvement currently symptom-free and on 4 L nasal cannula oxygen which is his baseline, will advance activity, taper down steroids, finished remdesivir course and prepare for discharge in the next 2 to 3 days.  O2 requirements:  SpO2: 97 % O2 Flow Rate (L/min): 4 L/min   COVID-19 Labs: Recent Labs    10/28/19 1630 10/29/19 0207 10/30/19 0207  DDIMER 0.58* 0.58* 0.57*  FERRITIN 147 135 130  CRP 10.8* 8.1* 4.2*       Component Value Date/Time   BNP 102.4 (H) 10/28/2019 1630    Recent Labs  Lab 10/28/19 1630  PROCALCITON  <0.10    No results found for: SARSCOV2NAA   COVID-19 medications Remdesivir: 1/27>> Steroids: 1/27>> Actemra: 1/28 x 1 at [facility]   Chronic diastolic heart failure: Did receive IV diuretics at [facility].  Currently euvolemic-resume usual home regimen of Lasix-20 mg daily.  ?Dysphagia: Per RN yesterday-patient developed coughing/choking after eating regular consistency  diet-he is missing his dentures-downgraded to dysphagia 2 diet-speech therapy has been consulted.  HTN: Controlled-benazepril remains on hold.  COPD: Some scattered rhonchi but no overt exacerbation-continue bronchodilators.  Dyslipidemia: Resume statins  History of prostate cancer: Stable for outpatient follow-up with oncology  Consults  :  None  Procedures  :  None  Condition -fair  Family Communication  :  Spouse updated over the phone was updated by previous MD.  Code Status :  DNR (rediscussed this with spouse today-she is aware of this-and concurs)  Diet :  Diet Order            DIET DYS 2 Room service appropriate? Yes; Fluid consistency: Thin  Diet effective now               Disposition Plan  : Remain hospitalized will discharge to home with home health versus SNF once hypoxia improves, still on oxygen.  Antimicorbials  :    Anti-infectives (From admission, onward)   Start     Dose/Rate Route Frequency Ordered Stop   10/29/19 1000  remdesivir 100 mg in sodium chloride 0.9 % 100 mL IVPB  Status:  Discontinued     100 mg 200 mL/hr over 30 Minutes Intravenous Daily 10/28/19 1609 10/28/19 1617   10/28/19 1800  remdesivir 100 mg in sodium chloride 0.9 % 100 mL IVPB     100 mg 200 mL/hr over 30 Minutes Intravenous Daily 10/28/19 1617 11/01/19 0959   10/28/19 1615  remdesivir 200 mg in sodium chloride 0.9% 250 mL IVPB  Status:  Discontinued     200 mg 580 mL/hr over 30 Minutes Intravenous Once 10/28/19 1609 10/28/19 1617     DVT prophylaxis: Lovenox  Inpatient  Medications  Scheduled Meds: . vitamin C  500 mg Oral Daily  . aspirin EC  81 mg Oral Daily  . cholecalciferol  400 Units Oral Daily  . enoxaparin (LOVENOX) injection  40 mg Subcutaneous Q24H  . ferrous sulfate  325 mg Oral Q breakfast  . fluticasone  1 spray Each Nare Daily  . fluticasone furoate-vilanterol  1 puff Inhalation Daily  . furosemide  20 mg Oral Daily  . insulin aspart  0-9 Units Subcutaneous TID WC  . Ipratropium-Albuterol  1 puff Inhalation Q6H  . latanoprost  1 drop Both Eyes QHS  . methylPREDNISolone (SOLU-MEDROL) injection  40 mg Intravenous Daily  . pravastatin  10 mg Oral q1800  . umeclidinium bromide  1 puff Inhalation Daily  . zinc sulfate  220 mg Oral Daily   Continuous Infusions: . remdesivir 100 mg in NS 100 mL 100 mg (10/30/19 1000)   PRN Meds:.acetaminophen, alum & mag hydroxide-simeth, chlorpheniramine-HYDROcodone, dextrose, guaiFENesin-dextromethorphan, [DISCONTINUED] ondansetron **OR** ondansetron (ZOFRAN) IV, polyethylene glycol   Time Spent in minutes  25  See all Orders from today for further details   Lala Lund M.D on 10/30/2019 at 12:19 PM  To page go to [email or web address] - use universal password  [facility] -  Office  [phone]    Objective:   Vitals:   10/30/19 0600 10/30/19 0740 10/30/19 0743 10/30/19 1148  BP:   127/75 131/62  Pulse: 68  (!) 59 73  Resp:   19 17  Temp:  98.4 F (36.9 C) 98.4 F (36.9 C) 98.4 F (36.9 C)  TempSrc:  Oral Oral Oral  SpO2:   100% 97%  Weight:      Height:        Wt Readings from Last 3 Encounters:  10/30/19  80.8 kg     Intake/Output Summary (Last 24 hours) at 10/30/2019 1219 Last data filed at 10/30/2019 0648 Gross per 24 hour  Intake 240 ml  Output 1200 ml  Net -960 ml     Physical Exam  Awake Alert, No new F.N deficits, Normal affect [place].AT,PERRAL Supple Neck,No JVD, No cervical lymphadenopathy appriciated.  Symmetrical Chest wall movement, Good air movement  bilaterally, few rales RRR,No Gallops, Rubs or new Murmurs, No Parasternal Heave +ve B.Sounds, Abd Soft, No tenderness, No organomegaly appriciated, No rebound - guarding or rigidity. No Cyanosis, Clubbing or edema, No new Rash or bruise    Data Review:    CBC Recent Labs  Lab 10/28/19 1630 10/29/19 0207 10/30/19 0207  WBC 8.4 10.5 12.6*  HGB 11.4* 11.3* 11.3*  HCT 37.4* 36.6* 37.6*  PLT 400 375 440*  MCV 105.6* 105.8* 105.6*  MCH 32.2 32.7 31.7  MCHC 30.5 30.9 30.1  RDW 13.2 13.2 13.2  LYMPHSABS 0.6* 1.0 0.9  MONOABS 0.4 0.2 0.3  EOSABS 0.0 0.0 0.0  BASOSABS 0.0 0.0 0.0    Chemistries  Recent Labs  Lab 10/28/19 1630 10/29/19 0207 10/30/19 0207  NA 142 140 138  K 4.6 4.6 4.5  CL 90* 92* 93*  CO2 38* 39* 36*  GLUCOSE 157* 131* 216*  BUN 41* 42* 43*  CREATININE 1.25* 1.17 1.17  CALCIUM 9.3 9.3 9.1  AST 33 30 31  ALT 22 20 23   ALKPHOS 34* 36* 39  BILITOT 0.4 0.6 0.6   ------------------------------------------------------------------------------------------------------------------ No results for input(s): CHOL, HDL, LDLCALC, TRIG, CHOLHDL, LDLDIRECT in the last 72 hours.  Lab Results  Component Value Date   HGBA1C 5.9 (H) 10/28/2019   ------------------------------------------------------------------------------------------------------------------ No results for input(s): TSH, T4TOTAL, T3FREE, THYROIDAB in the last 72 hours.  Invalid input(s): FREET3 ------------------------------------------------------------------------------------------------------------------ Recent Labs    10/29/19 0207 10/30/19 0207  FERRITIN 135 130    Coagulation profile No results for input(s): INR, PROTIME in the last 168 hours.  Recent Labs    10/29/19 0207 10/30/19 0207  DDIMER 0.58* 0.57*    Cardiac Enzymes No results for input(s): CKMB, TROPONINI, MYOGLOBIN in the last 168 hours.  Invalid input(s):  CK ------------------------------------------------------------------------------------------------------------------    Component Value Date/Time   BNP 102.4 (H) 10/28/2019 1630    Micro Results No results found for this or any previous visit (from the past 240 hour(s)).  Radiology Reports DG Chest Port 1V same Day  Result Date: 10/28/2019 CLINICAL DATA:  79 year old male with shortness of breath EXAM: PORTABLE CHEST 1 VIEW COMPARISON:  10/27/2019 FINDINGS: Cardiomediastinal silhouette unchanged in size and contour with cardiomegaly. Low lung volumes with asymmetric elevation the right hemidiaphragm persists. No pneumothorax. Reticulonodular opacities of the bilateral lungs, worse on the left. Overall there is slight improvement compared to the prior plain film. No displaced fracture IMPRESSION: Persisting reticulonodular opacities of the bilateral lungs worst on the left, compatible with multifocal pneumonia. Electronically Signed   By: Corrie Mckusick D.O.   On: 10/28/2019 16:38

## 2019-10-30 NOTE — Evaluation (Addendum)
Clinical/Bedside Swallow Evaluation Patient Details  Name: Mario Dawson MRN: [id number] Date of Birth: Oct 25, 1940  Today's Date: 10/30/2019 Time: SLP Start Time (ACUTE ONLY): 65 SLP Stop Time (ACUTE ONLY): 1643 SLP Time Calculation (min) (ACUTE ONLY): 13 min  Past Medical History: History reviewed. No pertinent past medical history. Past Surgical History: History reviewed. No pertinent surgical history. HPI:  79 y.o. male with medical history significant of COPD on home O2-3-4 L, HLD, chronic diastolic heart failure, prostate cancer who was transferred to [facility] from [place] for evaluation and treatment of acute on chronic hypoxic respiratory failure secondary to COVID-19 pneumonia.   Assessment / Plan / Recommendation Clinical Impression  Pt was seen for a bedside swallow evaluation and he presents with oral dysphagia secondary to edentulism.  Pt reported that his dentures are currently at home.  Pt consumed trials of thin liquid, puree, and soft solids.  He was able to independently feed himself given set up and he exhibited good labial closure, timely AP transport, and suspected timely swallow initiation with thin liquid and puree trials.  Mastication of soft solids was prolonged and minimal oral residue was observed following swallow initiation.  No clinical s/sx of aspiration were observed with any trials.  Spoke with pt regarding preference for Dysphagia 1 (puree) solids vs Dysphagia 2 (fine chop) solids and he stated that he preferred pureed solids at this time.  Therefore, recommend diet change to Dysphagia 1 (puree) solids with thin liquid and medication administered whole or crushed in puree.  Pt may upgrade to Dysphagia 2 (fine chop) solids per pt preference and MD discretion.  No further skilled ST is warranted at this time.  Please re-consult if additional needs arise.    SLP Visit Diagnosis: Dysphagia, oral phase (R13.11)    Aspiration Risk  Mild aspiration risk    Diet  Recommendation Dysphagia 1 (Puree);Thin liquid   Liquid Administration via: Cup;Straw Medication Administration: Whole meds with puree Supervision: Patient able to self feed;Intermittent supervision to cue for compensatory strategies Compensations: Minimize environmental distractions;Slow rate;Small sips/bites Postural Changes: Seated upright at 90 degrees    Other  Recommendations Oral Care Recommendations: Oral care BID   Follow up Recommendations None      Frequency and Duration            Prognosis Prognosis for Safe Diet Advancement: Fair Barriers to Reach Goals: Cognitive deficits      Swallow Study   General HPI: 79 y.o. male with medical history significant of COPD on home O2-3-4 L, HLD, chronic diastolic heart failure, prostate cancer who was transferred to [facility] from [place] for evaluation and treatment of acute on chronic hypoxic respiratory failure secondary to COVID-19 pneumonia. Type of Study: Bedside Swallow Evaluation Previous Swallow Assessment: None documented  Diet Prior to this Study: Dysphagia 2 (chopped);Thin liquids Temperature Spikes Noted: Yes Respiratory Status: Nasal cannula History of Recent Intubation: No Behavior/Cognition: Alert;Cooperative;Pleasant mood;Confused Oral Cavity Assessment: Within Functional Limits Oral Care Completed by SLP: No Oral Cavity - Dentition: Edentulous(dentures at home ) Vision: Functional for self-feeding Self-Feeding Abilities: Able to feed self Patient Positioning: Upright in chair Baseline Vocal Quality: Normal Volitional Swallow: Able to elicit    Oral/Motor/Sensory Function Overall Oral Motor/Sensory Function: Within functional limits   Ice Chips Ice chips: Not tested   Thin Liquid Thin Liquid: Within functional limits Presentation: Straw;Cup;Self Fed    Nectar Thick Nectar Thick Liquid: Not tested   Honey Thick Honey Thick Liquid: Not tested   Puree Puree: Within functional  limits Presentation: Self  Fed;Spoon   Solid     Solid: Impaired Presentation: Spoon Oral Phase Impairments: Impaired mastication Oral Phase Functional Implications: Impaired mastication;Prolonged oral transit;Oral residue     Colin Mulders M.S., CCC-SLP Acute Rehabilitation Services Office: [phone]  Elvia Collum Elad Macphail 10/30/2019,4:57 PM

## 2019-10-31 LAB — CBC WITH DIFFERENTIAL/PLATELET
Abs Immature Granulocytes: 0.51 10*3/uL — ABNORMAL HIGH (ref 0.00–0.07)
Basophils Absolute: 0.1 10*3/uL (ref 0.0–0.1)
Basophils Relative: 0 %
Eosinophils Absolute: 0 10*3/uL (ref 0.0–0.5)
Eosinophils Relative: 0 %
HCT: 36.5 % — ABNORMAL LOW (ref 39.0–52.0)
Hemoglobin: 11 g/dL — ABNORMAL LOW (ref 13.0–17.0)
Immature Granulocytes: 4 %
Lymphocytes Relative: 10 %
Lymphs Abs: 1.4 10*3/uL (ref 0.7–4.0)
MCH: 31.6 pg (ref 26.0–34.0)
MCHC: 30.1 g/dL (ref 30.0–36.0)
MCV: 104.9 fL — ABNORMAL HIGH (ref 80.0–100.0)
Monocytes Absolute: 0.7 10*3/uL (ref 0.1–1.0)
Monocytes Relative: 5 %
Neutro Abs: 10.8 10*3/uL — ABNORMAL HIGH (ref 1.7–7.7)
Neutrophils Relative %: 81 %
Platelets: 455 10*3/uL — ABNORMAL HIGH (ref 150–400)
RBC: 3.48 MIL/uL — ABNORMAL LOW (ref 4.22–5.81)
RDW: 13.3 % (ref 11.5–15.5)
WBC: 13.4 10*3/uL — ABNORMAL HIGH (ref 4.0–10.5)
nRBC: 0.7 % — ABNORMAL HIGH (ref 0.0–0.2)

## 2019-10-31 LAB — GLUCOSE, CAPILLARY
Glucose-Capillary: 183 mg/dL — ABNORMAL HIGH (ref 70–99)
Glucose-Capillary: 131 mg/dL — ABNORMAL HIGH (ref 70–99)
Glucose-Capillary: 196 mg/dL — ABNORMAL HIGH (ref 70–99)
Glucose-Capillary: 184 mg/dL — ABNORMAL HIGH (ref 70–99)
Glucose-Capillary: 242 mg/dL — ABNORMAL HIGH (ref 70–99)

## 2019-10-31 LAB — COMPREHENSIVE METABOLIC PANEL
ALT: 25 U/L (ref 0–44)
AST: 34 U/L (ref 15–41)
Albumin: 2.7 g/dL — ABNORMAL LOW (ref 3.5–5.0)
Alkaline Phosphatase: 37 U/L — ABNORMAL LOW (ref 38–126)
Anion gap: 9 (ref 5–15)
BUN: 42 mg/dL — ABNORMAL HIGH (ref 8–23)
CO2: 38 mmol/L — ABNORMAL HIGH (ref 22–32)
Calcium: 9.1 mg/dL (ref 8.9–10.3)
Chloride: 92 mmol/L — ABNORMAL LOW (ref 98–111)
Creatinine, Ser: 1.05 mg/dL (ref 0.61–1.24)
GFR calc Af Amer: >60 mL/min (ref >60)
GFR calc non Af Amer: >60 mL/min (ref >60)
Glucose, Bld: 105 mg/dL — ABNORMAL HIGH (ref 70–99)
Potassium: 4.3 mmol/L (ref 3.5–5.1)
Sodium: 139 mmol/L (ref 135–145)
Total Bilirubin: 0.4 mg/dL (ref 0.3–1.2)
Total Protein: 6 g/dL — ABNORMAL LOW (ref 6.5–8.1)

## 2019-10-31 LAB — C-REACTIVE PROTEIN: CRP: 2 mg/dL — ABNORMAL HIGH (ref <1.0)

## 2019-10-31 LAB — D-DIMER, QUANTITATIVE: D-Dimer, Quant: 0.55 ug/mL-FEU — ABNORMAL HIGH (ref 0.00–0.50)

## 2019-10-31 LAB — BRAIN NATRIURETIC PEPTIDE: B Natriuretic Peptide: 75.6 pg/mL (ref 0.0–100.0)

## 2019-10-31 MED ORDER — FUROSEMIDE 10 MG/ML IJ SOLN
40.0000 mg | Freq: Once | INTRAMUSCULAR | Status: AC
  Administered 2019-10-31: 40 mg via INTRAVENOUS
  Filled 2019-10-31: qty 4

## 2019-10-31 MED ORDER — METHYLPREDNISOLONE SODIUM SUCC 40 MG IJ SOLR
20.0000 mg | Freq: Every day | INTRAMUSCULAR | Status: DC
  Administered 2019-11-01: 09:00:00 20 mg via INTRAVENOUS
  Filled 2019-10-31: qty 1

## 2019-10-31 MED ORDER — LORAZEPAM 1 MG PO TABS
1.0000 mg | ORAL_TABLET | Freq: Three times a day (TID) | ORAL | Status: DC | PRN
  Administered 2019-10-31: 1 mg via ORAL
  Filled 2019-10-31: qty 1

## 2019-10-31 MED ORDER — LORAZEPAM 0.5 MG PO TABS
0.5000 mg | ORAL_TABLET | Freq: Once | ORAL | Status: AC | PRN
  Administered 2019-10-31: 0.5 mg via ORAL
  Filled 2019-10-31: qty 1

## 2019-10-31 NOTE — Progress Notes (Signed)
PROGRESS NOTE                                                                                                                                                                                                             Patient Demographics:    Mario Dawson, is a 79 y.o. male, DOB - 13-Sep-1941, [id number]  Outpatient Primary MD for the patient is Patient, No Pcp Per   Admit date - 10/28/2019   LOS - 3  CC - SOB     Brief Narrative:  Mario Dawson is a 79 y.o. male with medical history significant of COPD on home O2-3-4 L, HLD, chronic diastolic heart failure, prostate cancer who was transferred to [facility] from [place] for evaluation and treatment of acute on chronic hypoxic respiratory failure secondary to COVID-19 pneumonia. When he initially presented to [facility] ED-he was on 15 L NRB-but was subsequently transition to around 4-5 L of oxygen.   Subjective:   Patient in bed, appears comfortable, denies any headache, no fever, no chest pain or pressure, improved shortness of breath , no abdominal pain. No focal weakness.   Assessment  & Plan :   Acute on chronic hypoxic Resp Failure due to Covid 19 Viral pneumonia: He had severe disease and was treated with combination of IV steroids, remdesivir and Actemra along with Rocephin and azithromycin at [place].  He has shown remarkable improvement currently symptom-free and on 4 L nasal cannula oxygen which is his baseline, will advance activity, taper down steroids, finished remdesivir course and prepare for discharge in the next 2 to 3 days.  O2 requirements:  SpO2: 98 % O2 Flow Rate (L/min): 4 L/min   COVID-19 Labs: Recent Labs    10/28/19 1630 10/28/19 1630 10/29/19 0207 10/30/19 0207 10/31/19 0202  DDIMER 0.58*   < > 0.58* 0.57* 0.55*  FERRITIN 147  --  135 130  --   CRP 10.8*   < > 8.1* 4.2* 2.0*   < > = values in this interval not displayed.         Component Value Date/Time   BNP 75.6 10/31/2019 0202    Recent Labs  Lab 10/28/19 1630  PROCALCITON <0.10    No results found for: SARSCOV2NAA   COVID-19 medications Remdesivir: 1/27>> Steroids: 1/27>> Actemra: 1/28 x 1 at [place]  diastolic heart failure: Mild acute on chronic component IV Lasix on 10/31/2019.  ?Dysphagia: Per RN yesterday-patient developed coughing/choking after eating regular consistency diet-he is missing his dentures-downgraded to dysphagia 2 diet-speech therapy has been consulted.  HTN: Controlled-benazepril remains on hold.  COPD: Some scattered rhonchi but no overt exacerbation-continue bronchodilators.  Dyslipidemia: Resume statins  History of prostate cancer: Stable for outpatient follow-up with oncology  Consults  :  None  Procedures  :  None  Condition -fair  Family Communication  :  Spouse updated over the phone was updated by previous MD.  Code Status :  DNR (rediscussed this with spouse today-she is aware of this-and concurs)  Diet :  Diet Order            DIET - DYS 1 Room service appropriate? No; Fluid consistency: Thin  Diet effective now               Disposition Plan  : Still hypoxic and symptomatic with his COVID-19 infection, on IV steroids.  Most likely discharge home with home health in the next 1 to 2 days if clinical improvement continues.  Home health PT and case management consult ordered.  Antimicorbials  :    Anti-infectives (From admission, onward)   Start     Dose/Rate Route Frequency Ordered Stop   10/29/19 1000  remdesivir 100 mg in sodium chloride 0.9 % 100 mL IVPB  Status:  Discontinued     100 mg 200 mL/hr over 30 Minutes Intravenous Daily 10/28/19 1609 10/28/19 1617   10/28/19 1800  remdesivir 100 mg in sodium chloride 0.9 % 100 mL IVPB     100 mg 200 mL/hr over 30 Minutes Intravenous Daily 10/28/19 1617 10/31/19 0944   10/28/19 1615  remdesivir 200 mg in sodium chloride 0.9% 250  mL IVPB  Status:  Discontinued     200 mg 580 mL/hr over 30 Minutes Intravenous Once 10/28/19 1609 10/28/19 1617     DVT prophylaxis: Lovenox  Inpatient Medications  Scheduled Meds: . vitamin C  500 mg Oral Daily  . aspirin EC  81 mg Oral Daily  . cholecalciferol  400 Units Oral Daily  . enoxaparin (LOVENOX) injection  40 mg Subcutaneous Q24H  . ferrous sulfate  325 mg Oral Q breakfast  . fluticasone  1 spray Each Nare Daily  . fluticasone furoate-vilanterol  1 puff Inhalation Daily  . furosemide  20 mg Oral Daily  . insulin aspart  0-9 Units Subcutaneous TID WC  . Ipratropium-Albuterol  1 puff Inhalation Q6H  . latanoprost  1 drop Both Eyes QHS  . methylPREDNISolone (SOLU-MEDROL) injection  40 mg Intravenous Daily  . pravastatin  10 mg Oral q1800  . umeclidinium bromide  1 puff Inhalation Daily  . zinc sulfate  220 mg Oral Daily   Continuous Infusions:  PRN Meds:.acetaminophen, alum & mag hydroxide-simeth, chlorpheniramine-HYDROcodone, dextrose, guaiFENesin-dextromethorphan, [DISCONTINUED] ondansetron **OR** ondansetron (ZOFRAN) IV, polyethylene glycol   Time Spent in minutes  25  See all Orders from today for further details   Lala Lund M.D on 10/31/2019 at 11:32 AM  To page go to [email or web address] - use universal password  [facility] -  Office  [phone]    Objective:   Vitals:   10/30/19 2009 10/31/19 0035 10/31/19 0439 10/31/19 0756  BP: 128/77 139/77 134/69 125/67  Pulse: 78 (!) 56 69 78  Resp: (!) 23 19 19  (!) 24  Temp: 98.2 F (36.8 C) (!) 100.4 F (38 C) (!) 97.5 F (36.4  C) 98.2 F (36.8 C)  TempSrc: Oral Axillary Oral Oral  SpO2: 96% 100% 97% 98%  Weight:      Height:        Wt Readings from Last 3 Encounters:  10/30/19 80.8 kg     Intake/Output Summary (Last 24 hours) at 10/31/2019 1132 Last data filed at 10/30/2019 2000 Gross per 24 hour  Intake 340 ml  Output 900 ml  Net -560 ml     Physical Exam  Awake Alert, No new  F.N deficits, Normal affect [place].AT,PERRAL Supple Neck,No JVD, No cervical lymphadenopathy appriciated.  Symmetrical Chest wall movement, Good air movement bilaterally, +ve rales RRR,No Gallops, Rubs or new Murmurs, No Parasternal Heave +ve B.Sounds, Abd Soft, No tenderness, No organomegaly appriciated, No rebound - guarding or rigidity. No Cyanosis, Clubbing or edema, No new Rash or bruise     Data Review:    CBC Recent Labs  Lab 10/28/19 1630 10/29/19 0207 10/30/19 0207 10/31/19 0202  WBC 8.4 10.5 12.6* 13.4*  HGB 11.4* 11.3* 11.3* 11.0*  HCT 37.4* 36.6* 37.6* 36.5*  PLT 400 375 440* 455*  MCV 105.6* 105.8* 105.6* 104.9*  MCH 32.2 32.7 31.7 31.6  MCHC 30.5 30.9 30.1 30.1  RDW 13.2 13.2 13.2 13.3  LYMPHSABS 0.6* 1.0 0.9 1.4  MONOABS 0.4 0.2 0.3 0.7  EOSABS 0.0 0.0 0.0 0.0  BASOSABS 0.0 0.0 0.0 0.1    Chemistries  Recent Labs  Lab 10/28/19 1630 10/29/19 0207 10/30/19 0207 10/31/19 0202  NA 142 140 138 139  K 4.6 4.6 4.5 4.3  CL 90* 92* 93* 92*  CO2 38* 39* 36* 38*  GLUCOSE 157* 131* 216* 105*  BUN 41* 42* 43* 42*  CREATININE 1.25* 1.17 1.17 1.05  CALCIUM 9.3 9.3 9.1 9.1  AST 33 30 31 34  ALT 22 20 23 25   ALKPHOS 34* 36* 39 37*  BILITOT 0.4 0.6 0.6 0.4   ------------------------------------------------------------------------------------------------------------------ No results for input(s): CHOL, HDL, LDLCALC, TRIG, CHOLHDL, LDLDIRECT in the last 72 hours.  Lab Results  Component Value Date   HGBA1C 5.9 (H) 10/28/2019   ------------------------------------------------------------------------------------------------------------------ No results for input(s): TSH, T4TOTAL, T3FREE, THYROIDAB in the last 72 hours.  Invalid input(s): FREET3 ------------------------------------------------------------------------------------------------------------------ Recent Labs    10/29/19 0207 10/30/19 0207  FERRITIN 135 130    Coagulation profile No results for  input(s): INR, PROTIME in the last 168 hours.  Recent Labs    10/30/19 0207 10/31/19 0202  DDIMER 0.57* 0.55*    Cardiac Enzymes No results for input(s): CKMB, TROPONINI, MYOGLOBIN in the last 168 hours.  Invalid input(s): CK ------------------------------------------------------------------------------------------------------------------    Component Value Date/Time   BNP 75.6 10/31/2019 0202    Micro Results No results found for this or any previous visit (from the past 240 hour(s)).  Radiology Reports DG Chest Port 1V same Day  Result Date: 10/28/2019 CLINICAL DATA:  79 year old male with shortness of breath EXAM: PORTABLE CHEST 1 VIEW COMPARISON:  10/27/2019 FINDINGS: Cardiomediastinal silhouette unchanged in size and contour with cardiomegaly. Low lung volumes with asymmetric elevation the right hemidiaphragm persists. No pneumothorax. Reticulonodular opacities of the bilateral lungs, worse on the left. Overall there is slight improvement compared to the prior plain film. No displaced fracture IMPRESSION: Persisting reticulonodular opacities of the bilateral lungs worst on the left, compatible with multifocal pneumonia. Electronically Signed   By: Corrie Mckusick D.O.   On: 10/28/2019 16:38

## 2019-10-31 NOTE — Progress Notes (Signed)
Daughter updated over the phone today. All questions & concerns addressed.  Pt spent most of the day in recliner 3-4L NO pain Condom cath in place.

## 2019-11-01 LAB — CBC WITH DIFFERENTIAL/PLATELET
Abs Immature Granulocytes: 0.72 10*3/uL — ABNORMAL HIGH (ref 0.00–0.07)
Basophils Absolute: 0.1 10*3/uL (ref 0.0–0.1)
Basophils Relative: 1 %
Eosinophils Absolute: 0 10*3/uL (ref 0.0–0.5)
Eosinophils Relative: 0 %
HCT: 40.1 % (ref 39.0–52.0)
Hemoglobin: 12.3 g/dL — ABNORMAL LOW (ref 13.0–17.0)
Immature Granulocytes: 4 %
Lymphocytes Relative: 11 %
Lymphs Abs: 1.9 10*3/uL (ref 0.7–4.0)
MCH: 31.9 pg (ref 26.0–34.0)
MCHC: 30.7 g/dL (ref 30.0–36.0)
MCV: 104.2 fL — ABNORMAL HIGH (ref 80.0–100.0)
Monocytes Absolute: 0.9 10*3/uL (ref 0.1–1.0)
Monocytes Relative: 5 %
Neutro Abs: 13.5 10*3/uL — ABNORMAL HIGH (ref 1.7–7.7)
Neutrophils Relative %: 79 %
Platelets: 466 10*3/uL — ABNORMAL HIGH (ref 150–400)
RBC: 3.85 MIL/uL — ABNORMAL LOW (ref 4.22–5.81)
RDW: 13.6 % (ref 11.5–15.5)
WBC: 17.1 10*3/uL — ABNORMAL HIGH (ref 4.0–10.5)
nRBC: 1.5 % — ABNORMAL HIGH (ref 0.0–0.2)

## 2019-11-01 LAB — D-DIMER, QUANTITATIVE: D-Dimer, Quant: 0.5 ug/mL-FEU (ref 0.00–0.50)

## 2019-11-01 LAB — COMPREHENSIVE METABOLIC PANEL
ALT: 23 U/L (ref 0–44)
AST: 25 U/L (ref 15–41)
Albumin: 2.9 g/dL — ABNORMAL LOW (ref 3.5–5.0)
Alkaline Phosphatase: 41 U/L (ref 38–126)
Anion gap: 11 (ref 5–15)
BUN: 45 mg/dL — ABNORMAL HIGH (ref 8–23)
CO2: 34 mmol/L — ABNORMAL HIGH (ref 22–32)
Calcium: 9.2 mg/dL (ref 8.9–10.3)
Chloride: 93 mmol/L — ABNORMAL LOW (ref 98–111)
Creatinine, Ser: 1.11 mg/dL (ref 0.61–1.24)
GFR calc Af Amer: >60 mL/min (ref >60)
GFR calc non Af Amer: >60 mL/min (ref >60)
Glucose, Bld: 79 mg/dL (ref 70–99)
Potassium: 4 mmol/L (ref 3.5–5.1)
Sodium: 138 mmol/L (ref 135–145)
Total Bilirubin: 0.9 mg/dL (ref 0.3–1.2)
Total Protein: 6.1 g/dL — ABNORMAL LOW (ref 6.5–8.1)

## 2019-11-01 LAB — C-REACTIVE PROTEIN: CRP: 1.4 mg/dL — ABNORMAL HIGH (ref <1.0)

## 2019-11-01 LAB — GLUCOSE, CAPILLARY: Glucose-Capillary: 136 mg/dL — ABNORMAL HIGH (ref 70–99)

## 2019-11-01 LAB — BRAIN NATRIURETIC PEPTIDE: B Natriuretic Peptide: 79.1 pg/mL (ref 0.0–100.0)

## 2019-11-01 NOTE — Plan of Care (Signed)
Plan of Care reviewed. 

## 2019-11-01 NOTE — Progress Notes (Signed)
Occupational Therapy Treatment Patient Details Name: Mario Dawson MRN: [id number] DOB: 17-Nov-1940 Today's Date: 11/01/2019    History of present illness 79 y.o. male with medical history significant of COPD on home O2-3-4 L, HLD, chronic diastolic heart failure, prostate cancer who was transferred to [facility] from [place] for evaluation and treatment of acute on chronic hypoxic respiratory failure secondary to COVID-19 pneumonia.   OT comments  Pt progressing towards established OT goals. Pt performing functional mobility to bathroom with Min Guard A and RW for grooming at sink. Pt requiring seated rest break and to remain seated for washing his face due to fatigue. SpO2 dropping to 85% on 2L O2; able to purse lip breathing and return to 90s. Continue to recommend dc to home and will continue to follow acutely as admitted.    Follow Up Recommendations  Home health OT;Supervision/Assistance - 24 hour    Equipment Recommendations  Other (comment)(Rolling walker)    Recommendations for Other Services      Precautions / Restrictions Precautions Precautions: Fall Precaution Comments: 4L home O2 Restrictions Weight Bearing Restrictions: No       Mobility Bed Mobility               General bed mobility comments: Sitting in recliner upon arrival  Transfers Overall transfer level: Needs assistance Equipment used: Rolling walker (2 wheeled) Transfers: Sit to/from Stand Sit to Stand: Min guard         General transfer comment: Min Guard A. cues for hand placement as pt presenting with decreased safety awareness    Balance Overall balance assessment: Needs assistance Sitting-balance support: Feet supported Sitting balance-Leahy Scale: Good     Standing balance support: Bilateral upper extremity supported Standing balance-Leahy Scale: Poor Standing balance comment: Reliant on UE support                           ADL either performed or assessed with  clinical judgement   ADL Overall ADL's : Needs assistance/impaired     Grooming: Supervision/safety;Sitting;Set up;Wash/dry face Grooming Details (indicate cue type and reason): Requiring cues to perform grooming at sink. Pt stating "You do it." and Encouraging pt to perform tasks himself. Pt SpO2 dropping to 85% on 2L during grooming, but quickly returned to 90s with purse lip breathing                 Toilet Transfer: Min guard;Ambulation;RW;BSC Toilet Transfer Details (indicate cue type and reason): Requiring cues for hand placement and safety. Min Guard A for safety in transition         Functional mobility during ADLs: Min guard;Rolling walker General ADL Comments: Pt presenting with decreased activity tolerance requiring seated rest break. Quickly recovered. Following cues and motivated to participate.      Vision       Perception     Praxis      Cognition Arousal/Alertness: Awake/alert Behavior During Therapy: WFL for tasks assessed/performed Overall Cognitive Status: No family/caregiver present to determine baseline cognitive functioning                                 General Comments: Requiring increased cues and time throughout. Also with HOH and requiring repeated cues. Following commands to purse lip breath. Motivated to participate        Exercises Exercises: Other exercises Other Exercises Other Exercises: Purse lip breathing   Shoulder  Instructions       General Comments SpO2 dropping to 83% on room air with activity. Requiring 2L to maintain SpO2 >85%. Reporting dizziness. RR 33 and HR 103 at highest. BP 146/75    Pertinent Vitals/ Pain       Pain Assessment: No/denies pain  Home Living                                          Prior Functioning/Environment              Frequency  Min 3X/week        Progress Toward Goals  OT Goals(current goals can now be found in the care plan section)   Progress towards OT goals: Progressing toward goals  Acute Rehab OT Goals Patient Stated Goal: To go home to family OT Goal Formulation: With patient Time For Goal Achievement: 11/12/19 Potential to Achieve Goals: Good ADL Goals Pt Will Perform Grooming: with modified independence;standing Pt Will Perform Upper Body Bathing: with set-up;sitting Pt Will Transfer to Toilet: ambulating;with supervision Pt/caregiver will Perform Home Exercise Program: Increased strength;Both right and left upper extremity;With theraband Additional ADL Goal #1: Patient will independently verbalize 3 fall prevention strategies.  Plan Discharge plan remains appropriate    Co-evaluation                 AM-PAC OT "6 Clicks" Daily Activity     Outcome Measure   Help from another person eating meals?: None Help from another person taking care of personal grooming?: A Little Help from another person toileting, which includes using toliet, bedpan, or urinal?: A Lot Help from another person bathing (including washing, rinsing, drying)?: A Lot Help from another person to put on and taking off regular upper body clothing?: A Little Help from another person to put on and taking off regular lower body clothing?: A Lot 6 Click Score: 16    End of Session Equipment Utilized During Treatment: Rolling walker;Oxygen(2L)  OT Visit Diagnosis: Unsteadiness on feet (R26.81);Muscle weakness (generalized) (M62.81);Other symptoms and signs involving cognitive function   Activity Tolerance Patient tolerated treatment well   Patient Left in chair;with call bell/phone within reach;with chair alarm set;with nursing/sitter in room   Nurse Communication Mobility status        Time: 0829-0910 OT Time Calculation (min): 41 min  Charges: OT General Charges $OT Visit: 1 Visit OT Treatments $Self Care/Home Management : 38-52 mins  [place], OTR/L Acute Rehab Pager: [phone] Office:  [place] 11/01/2019, 10:52 AM

## 2019-11-01 NOTE — Progress Notes (Signed)
SATURATION QUALIFICATIONS: (This note is used to comply with regulatory documentation for home oxygen)  Patient Saturations on Room Air at Rest = 90-92%  Patient Saturations on Room Air while Ambulating = 83%  Patient Saturations on 2 Liters of oxygen while Ambulating = 92-85%  Please briefly explain why patient needs home oxygen: Pt requiring at least 2L to maintain SpO2 >85% duirng activity. Able to recover to 90s with seated rest break and increased time on 2L. Reports he wears 4L home O2.   [place], OTR/L Acute Rehab Pager: [phone] Office: [phone]

## 2019-11-01 NOTE — TOC Transition Note (Signed)
Transition of Care [facility]) - CM/SW Discharge Note   Patient Details  Name: Mario Dawson MRN: [id number] Date of Birth: 20-Feb-1941  [facility]) CM/SW Contact:  Ross Ludwig, LCSW Phone Number: 11/01/2019, 10:03 AM   Clinical Narrative:     Patient will be going home with home health through [facility].  CSW signing off please reconsult with any other social work needs, home health agency has been notified of planned discharge for today.  CSW spoke to patient's daughter Freda Munro and confirmed that patient has oxygen at home, and they do not need any other equipment.    Final next level of care: [place] Barriers to Discharge: Barriers Resolved   Patient Goals and CMS Choice Patient states their goals for this hospitalization and ongoing recovery are:: To return back home with home health. CMS Medicare.gov Compare Post Acute Care list provided to:: Patient Represenative (must comment) Choice offered to / list presented to : Adult Children  Discharge Placement   Patient discharging back home with home health.                Name of family member notified: Freda Munro patient's daughter Patient and family notified of of transfer: 11/01/19  Discharge Plan and Services                DME Arranged: N/A         HH Arranged: RN, PT, Speech Therapy HH Agency: [facility] Date [place]: 11/01/19 Time [place]: 1003 Representative spoke with at [place]: Amy  Social Determinants of Health ([place]) Interventions     Readmission Risk Interventions No flowsheet data found.

## 2019-11-01 NOTE — Discharge Instructions (Signed)
Follow with Primary MD in 7 days   Get CBC, CMP, 2 view Chest X ray -  checked next visit within 1 week by Primary MD    Activity: As tolerated with Full fall precautions use walker/cane & assistance as needed  Disposition Home    Diet: Soft diet with feeding assistance and aspiration precautions.  Special Instructions: If you have smoked or chewed Tobacco  in the last 2 yrs please stop smoking, stop any regular Alcohol  and or any Recreational drug use.  On your next visit with your primary care physician please Get Medicines reviewed and adjusted.  Please request your Prim.MD to go over all Hospital Tests and Procedure/Radiological results at the follow up, please get all Hospital records sent to your Prim MD by signing hospital release before you go home.  If you experience worsening of your admission symptoms, develop shortness of breath, life threatening emergency, suicidal or homicidal thoughts you must seek medical attention immediately by calling 911 or calling your MD immediately  if symptoms less severe.  You Must read complete instructions/literature along with all the possible adverse reactions/side effects for all the Medicines you take and that have been prescribed to you. Take any new Medicines after you have completely understood and accpet all the possible adverse reactions/side effects.       Person Under Monitoring Name: Mario Dawson  Location: [address] [place] [address]   Infection Prevention Recommendations for Individuals Confirmed to have, or Being Evaluated for, 2019 Novel Coronavirus (COVID-19) Infection Who Receive Care at Home  Individuals who are confirmed to have, or are being evaluated for, COVID-19 should follow the prevention steps below until a healthcare provider or local or state health department says they can return to normal activities.  Stay home except to get medical care You should restrict activities outside your home, except  for getting medical care. Do not go to work, school, or public areas, and do not use public transportation or taxis.  Call ahead before visiting your doctor Before your medical appointment, call the healthcare provider and tell them that you have, or are being evaluated for, COVID-19 infection. This will help the healthcare provider's office take steps to keep other people from getting infected. Ask your healthcare provider to call the local or state health department.  Monitor your symptoms Seek prompt medical attention if your illness is worsening (e.g., difficulty breathing). Before going to your medical appointment, call the healthcare provider and tell them that you have, or are being evaluated for, COVID-19 infection. Ask your healthcare provider to call the local or state health department.  Wear a facemask You should wear a facemask that covers your nose and mouth when you are in the same room with other people and when you visit a healthcare provider. People who live with or visit you should also wear a facemask while they are in the same room with you.  Separate yourself from other people in your home As much as possible, you should stay in a different room from other people in your home. Also, you should use a separate bathroom, if available.  Avoid sharing household items You should not share dishes, drinking glasses, cups, eating utensils, towels, bedding, or other items with other people in your home. After using these items, you should wash them thoroughly with soap and water.  Cover your coughs and sneezes Cover your mouth and nose with a tissue when you cough or sneeze, or you can cough  or sneeze into your sleeve. Throw used tissues in a lined trash can, and immediately wash your hands with soap and water for at least 20 seconds or use an alcohol-based hand rub.  Wash your [facility] your hands often and thoroughly with soap and water for at least 20 seconds. You can  use an alcohol-based hand sanitizer if soap and water are not available and if your hands are not visibly dirty. Avoid touching your eyes, nose, and mouth with unwashed hands.   Prevention Steps for Caregivers and Household Members of Individuals Confirmed to have, or Being Evaluated for, COVID-19 Infection Being Cared for in the Home  If you live with, or provide care at home for, a person confirmed to have, or being evaluated for, COVID-19 infection please follow these guidelines to prevent infection:  Follow healthcare provider's instructions Make sure that you understand and can help the patient follow any healthcare provider instructions for all care.  Provide for the patient's basic needs You should help the patient with basic needs in the home and provide support for getting groceries, prescriptions, and other personal needs.  Monitor the patient's symptoms If they are getting sicker, call his or her medical provider and tell them that the patient has, or is being evaluated for, COVID-19 infection. This will help the healthcare provider's office take steps to keep other people from getting infected. Ask the healthcare provider to call the local or state health department.  Limit the number of people who have contact with the patient  If possible, have only one caregiver for the patient.  Other household members should stay in another home or place of residence. If this is not possible, they should stay  in another room, or be separated from the patient as much as possible. Use a separate bathroom, if available.  Restrict visitors who do not have an essential need to be in the home.  Keep older adults, very young children, and other sick people away from the patient Keep older adults, very young children, and those who have compromised immune systems or chronic health conditions away from the patient. This includes people with chronic heart, lung, or kidney conditions,  diabetes, and cancer.  Ensure good ventilation Make sure that shared spaces in the home have good air flow, such as from an air conditioner or an opened window, weather permitting.  Wash your hands often  Wash your hands often and thoroughly with soap and water for at least 20 seconds. You can use an alcohol based hand sanitizer if soap and water are not available and if your hands are not visibly dirty.  Avoid touching your eyes, nose, and mouth with unwashed hands.  Use disposable paper towels to dry your hands. If not available, use dedicated cloth towels and replace them when they become wet.  Wear a facemask and gloves  Wear a disposable facemask at all times in the room and gloves when you touch or have contact with the patient's blood, body fluids, and/or secretions or excretions, such as sweat, saliva, sputum, nasal mucus, vomit, urine, or feces.  Ensure the mask fits over your nose and mouth tightly, and do not touch it during use.  Throw out disposable facemasks and gloves after using them. Do not reuse.  Wash your hands immediately after removing your facemask and gloves.  If your personal clothing becomes contaminated, carefully remove clothing and launder. Wash your hands after handling contaminated clothing.  Place all used disposable facemasks, gloves, and other  waste in a lined container before disposing them with other household waste.  Remove gloves and wash your hands immediately after handling these items.  Do not share dishes, glasses, or other household items with the patient  Avoid sharing household items. You should not share dishes, drinking glasses, cups, eating utensils, towels, bedding, or other items with a patient who is confirmed to have, or being evaluated for, COVID-19 infection.  After the person uses these items, you should wash them thoroughly with soap and water.  Wash laundry thoroughly  Immediately remove and wash clothes or bedding that have  blood, body fluids, and/or secretions or excretions, such as sweat, saliva, sputum, nasal mucus, vomit, urine, or feces, on them.  Wear gloves when handling laundry from the patient.  Read and follow directions on labels of laundry or clothing items and detergent. In general, wash and dry with the warmest temperatures recommended on the label.  Clean all areas the individual has used often  Clean all touchable surfaces, such as counters, tabletops, doorknobs, bathroom fixtures, toilets, phones, keyboards, tablets, and bedside tables, every day. Also, clean any surfaces that may have blood, body fluids, and/or secretions or excretions on them.  Wear gloves when cleaning surfaces the patient has come in contact with.  Use a diluted bleach solution (e.g., dilute bleach with 1 part bleach and 10 parts water) or a household disinfectant with a label that says EPA-registered for coronaviruses. To make a bleach solution at home, add 1 tablespoon of bleach to 1 quart (4 cups) of water. For a larger supply, add  cup of bleach to 1 gallon (16 cups) of water.  Read labels of cleaning products and follow recommendations provided on product labels. Labels contain instructions for safe and effective use of the cleaning product including precautions you should take when applying the product, such as wearing gloves or eye protection and making sure you have good ventilation during use of the product.  Remove gloves and wash hands immediately after cleaning.  Monitor yourself for signs and symptoms of illness Caregivers and household members are considered close contacts, should monitor their health, and will be asked to limit movement outside of the home to the extent possible. Follow the monitoring steps for close contacts listed on the symptom monitoring form.   ? If you have additional questions, contact your local health department or call the epidemiologist on call at [phone] (available 24/7). ?  This guidance is subject to change. For the most up-to-date guidance from [facility], please refer to their website: [email or web address]

## 2019-11-01 NOTE — Discharge Summary (Signed)
Mario Dawson ZYS:[id number] DOB: [birth year] DOA: 10/28/2019  PCP: Patient, No Pcp Per  Admit date: 10/28/2019  Discharge date: 11/01/2019  Admitted From: Home  Disposition:  Home   Recommendations for Outpatient Follow-up:   Follow up with PCP in 1-2 weeks  PCP Please obtain BMP/CBC, 2 view CXR in 1week,  (see Discharge instructions)   PCP Please follow up on the following pending results: Follow CBC, CMP and CXR in 1-2 weeks   Home Health: PT, RN, SLP, Aide Equipment/Devices: None  Consultations: None  Discharge Condition: Stable    CODE STATUS: Full    Diet Recommendation: Soft  CC - SOB  Brief history of present illness from the day of admission and additional interim summary    Mario Dawson a 79 y.o.malewith medical history significant ofCOPD on home O2-3-4 L, HLD, chronic diastolic heart failure, prostate cancer who was transferred to [facility] from [place] for evaluation and treatment of acute on chronic hypoxic respiratory failure secondary to COVID-19 pneumonia. When he initially presented to [facility] ED-he was on 15 L NRB-but was subsequently transition to around 4-5 L of oxygen.                                                                 Hospital Course   Acute on chronic hypoxic Resp Failure due to Covid 19 Viral pneumonia: He had severe disease and was treated with combination of IV steroids, remdesivir and Actemra along with Rocephin and azithromycin at [place].  He has shown remarkable improvement currently symptom-free and now at or better than baseline with 2 L nasal cannula oxygen, of note he uses about 3 to 4 L at home.  He has finished his IV remdesivir, will be placed on his home dose steroids, and discharged home with PCP follow-up.  Unfortunately his wife passed away yesterday  from Covid and he really wants to go home as soon as possible, will be discharged early this morning.   Recent Labs  Lab 10/28/19 1630 10/29/19 0207 10/30/19 0207 10/31/19 0202 11/01/19 0350  CRP 10.8* 8.1* 4.2* 2.0* 1.4*  DDIMER 0.58* 0.58* 0.57* 0.55* 0.50  FERRITIN 147 135 130  --   --   BNP 102.4*  --   --  75.6 79.1  PROCALCITON <0.10  --   --   --   --     Hepatic Function Latest Ref Rng & Units 11/01/2019 10/31/2019 10/30/2019  Total Protein 6.5 - 8.1 g/dL 6.1(L) 6.0(L) 6.4(L)  Albumin 3.5 - 5.0 g/dL 2.9(L) 2.7(L) 2.7(L)  AST 15 - 41 U/L 25 34 31  ALT 0 - 44 U/L '23 25 23  '$ Alk Phosphatase 38 - 126 U/L 41 37(L) 39  Total Bilirubin 0.3 - 1.2 mg/dL 0.9 0.4 0.6     Chronic diastolic heart  failure: Mild acute on chronic component IV Lasix on 10/31/2019.  ?Dysphagia:  On soft diet, was seen by speech and speech will be ordered at home also post discharge.  HTN: Controlled-benazepril remains on hold.  COPD: Some scattered rhonchi but no overt exacerbation-continue bronchodilators.  Dyslipidemia: Resume statins  History of prostate cancer: Stable for outpatient follow-up with oncology   Discharge diagnosis     Principal Problem:   Acute respiratory failure with hypoxia ([place]) Active Problems:   COVID-19 virus infection   Chronic diastolic CHF (congestive heart failure) (HCC)   Dyslipidemia   COPD with chronic bronchitis (H[facility])   Chronic respiratory failure with hypoxia, on home O2 therapy ([place])   Prostate cancer [facility])    Discharge instructions    Discharge Instructions    Discharge instructions   Complete by: As directed    Follow with Primary MD in 7 days   Get CBC, CMP, 2 view Chest X ray -  checked next visit within 1 week by Primary MD    Activity: As tolerated with Full fall precautions use walker/cane & assistance as needed  Disposition Home    Diet: Soft diet with feeding assistance and aspiration precautions.  Special Instructions: If you  have smoked or chewed Tobacco  in the last 2 yrs please stop smoking, stop any regular Alcohol  and or any Recreational drug use.  On your next visit with your primary care physician please Get Medicines reviewed and adjusted.  Please request your Prim.MD to go over all Hospital Tests and Procedure/Radiological results at the follow up, please get all Hospital records sent to your Prim MD by signing hospital release before you go home.  If you experience worsening of your admission symptoms, develop shortness of breath, life threatening emergency, suicidal or homicidal thoughts you must seek medical attention immediately by calling 911 or calling your MD immediately  if symptoms less severe.  You Must read complete instructions/literature along with all the possible adverse reactions/side effects for all the Medicines you take and that have been prescribed to you. Take any new Medicines after you have completely understood and accpet all the possible adverse reactions/side effects.   Increase activity slowly   Complete by: As directed    MyChart COVID-19 home monitoring program   Complete by: Nov 01, 2019    Is the patient willing to use the [place] for home monitoring?: Yes   Temperature monitoring   Complete by: Nov 01, 2019    After how many days would you like to receive a notification of this patient's flowsheet entries?: 1      Discharge Medications   Allergies as of 11/01/2019   No Known Allergies     Medication List    TAKE these medications   albuterol (2.5 MG/3ML) 0.083% nebulizer solution Commonly known as: PROVENTIL Take 2.5 mg by nebulization every 6 (six) hours as needed for wheezing or shortness of breath.   albuterol 108 (90 Base) MCG/ACT inhaler Commonly known as: VENTOLIN HFA Inhale 1-2 puffs into the lungs every 4 (four) hours as needed for wheezing or shortness of breath.   aspirin EC 81 MG tablet Take 81 mg by mouth daily.   benazepril 20 MG  tablet Commonly known as: LOTENSIN Take 10 mg by mouth daily.   ferrous sulfate 325 (65 FE) MG tablet Take 325 mg by mouth daily with breakfast.   fluticasone 50 MCG/ACT nasal spray Commonly known as: FLONASE Place 1 spray into both nostrils daily.  furosemide 20 MG tablet Commonly known as: LASIX Take 20 mg by mouth daily.   lovastatin 10 MG tablet Commonly known as: MEVACOR Take 10 mg by mouth daily.   Lumigan 0.01 % Soln Generic drug: bimatoprost Place 1 drop into both eyes at bedtime.   predniSONE 20 MG tablet Commonly known as: DELTASONE Take 20 mg by mouth daily with breakfast.   Trelegy Ellipta 100-62.5-25 MCG/INH Aepb Generic drug: Fluticasone-Umeclidin-Vilant Inhale 1 puff into the lungs daily.   VITAMIN B COMPLEX PO Take 1 tablet by mouth daily.   VITAMIN C GUMMIES PO Take 1 tablet by mouth daily.   Vitamin D-400 10 MCG (400 UNIT) Tabs tablet Generic drug: cholecalciferol Take 400 Units by mouth daily.       Follow-up Information    Your PCP. Schedule an appointment as soon as possible for a visit in 1 week(s).           Major procedures and Radiology Reports - PLEASE review detailed and final reports thoroughly  -        DG Chest Port 1V same Day  Result Date: 10/28/2019 CLINICAL DATA:  79 year old male with shortness of breath EXAM: PORTABLE CHEST 1 VIEW COMPARISON:  10/27/2019 FINDINGS: Cardiomediastinal silhouette unchanged in size and contour with cardiomegaly. Low lung volumes with asymmetric elevation the right hemidiaphragm persists. No pneumothorax. Reticulonodular opacities of the bilateral lungs, worse on the left. Overall there is slight improvement compared to the prior plain film. No displaced fracture IMPRESSION: Persisting reticulonodular opacities of the bilateral lungs worst on the left, compatible with multifocal pneumonia. Electronically Signed   By: Corrie Mckusick D.O.   On: 10/28/2019 16:38    Micro Results    No results  found for this or any previous visit (from the past 240 hour(s)).  Today   Subjective    Mario Dawson today has no headache,no chest abdominal pain,no new weakness tingling or numbness, feels much better wants to go home today.    Objective   Blood pressure (!) 135/94, pulse 77, temperature (!) 97.5 F (36.4 C), temperature source Oral, resp. rate (!) 22, height '5\' 11"'$  (1.803 m), weight 80.8 kg, SpO2 100 %.   Intake/Output Summary (Last 24 hours) at 11/01/2019 0825 Last data filed at 11/01/2019 0300 Gross per 24 hour  Intake 900 ml  Output 1850 ml  Net -950 ml    Exam  Awake Alert,  , No new F.N deficits, Normal affect [place].AT,PERRAL Supple Neck,No JVD, No cervical lymphadenopathy appriciated.  Symmetrical Chest wall movement, Good air movement bilaterally, CTAB RRR,No Gallops,Rubs or new Murmurs, No Parasternal Heave +ve B.Sounds, Abd Soft, Non tender, No organomegaly appriciated, No rebound -guarding or rigidity. No Cyanosis, Clubbing or edema, No new Rash or bruise   Data Review   CBC w Diff:  Lab Results  Component Value Date   WBC 17.1 (H) 11/01/2019   HGB 12.3 (L) 11/01/2019   HCT 40.1 11/01/2019   PLT 466 (H) 11/01/2019   LYMPHOPCT 11 11/01/2019   MONOPCT 5 11/01/2019   EOSPCT 0 11/01/2019   BASOPCT 1 11/01/2019    CMP:  Lab Results  Component Value Date   NA 138 11/01/2019   K 4.0 11/01/2019   CL 93 (L) 11/01/2019   CO2 34 (H) 11/01/2019   BUN 45 (H) 11/01/2019   CREATININE 1.11 11/01/2019   PROT 6.1 (L) 11/01/2019   ALBUMIN 2.9 (L) 11/01/2019   BILITOT 0.9 11/01/2019   ALKPHOS 41 11/01/2019   AST  25 11/01/2019   ALT 23 11/01/2019  .   Total Time in preparing paper work, data evaluation and todays exam - 57 minutes  Lala Lund M.D on 11/01/2019 at 8:25 AM  [facility]   Office  [phone]

## 2019-11-02 DIAGNOSIS — I5089 Other heart failure: Secondary | ICD-10-CM | POA: Diagnosis not present

## 2019-11-02 DIAGNOSIS — Z9981 Dependence on supplemental oxygen: Secondary | ICD-10-CM | POA: Diagnosis not present

## 2019-11-02 DIAGNOSIS — U071 COVID-19: Secondary | ICD-10-CM | POA: Diagnosis not present

## 2019-11-02 DIAGNOSIS — R269 Unspecified abnormalities of gait and mobility: Secondary | ICD-10-CM | POA: Diagnosis not present

## 2019-11-02 DIAGNOSIS — J441 Chronic obstructive pulmonary disease with (acute) exacerbation: Secondary | ICD-10-CM | POA: Diagnosis not present

## 2019-11-05 DIAGNOSIS — Z9981 Dependence on supplemental oxygen: Secondary | ICD-10-CM | POA: Diagnosis not present

## 2019-11-05 DIAGNOSIS — I5089 Other heart failure: Secondary | ICD-10-CM | POA: Diagnosis not present

## 2019-11-05 DIAGNOSIS — R269 Unspecified abnormalities of gait and mobility: Secondary | ICD-10-CM | POA: Diagnosis not present

## 2019-11-05 DIAGNOSIS — U071 COVID-19: Secondary | ICD-10-CM | POA: Diagnosis not present

## 2019-11-05 DIAGNOSIS — J441 Chronic obstructive pulmonary disease with (acute) exacerbation: Secondary | ICD-10-CM | POA: Diagnosis not present

## 2019-11-09 DIAGNOSIS — R269 Unspecified abnormalities of gait and mobility: Secondary | ICD-10-CM | POA: Diagnosis not present

## 2019-11-09 DIAGNOSIS — I5089 Other heart failure: Secondary | ICD-10-CM | POA: Diagnosis not present

## 2019-11-09 DIAGNOSIS — Z09 Encounter for follow-up examination after completed treatment for conditions other than malignant neoplasm: Secondary | ICD-10-CM | POA: Diagnosis not present

## 2019-11-09 DIAGNOSIS — Z634 Disappearance and death of family member: Secondary | ICD-10-CM | POA: Diagnosis not present

## 2019-11-09 DIAGNOSIS — U071 COVID-19: Secondary | ICD-10-CM | POA: Diagnosis not present

## 2019-11-09 DIAGNOSIS — J441 Chronic obstructive pulmonary disease with (acute) exacerbation: Secondary | ICD-10-CM | POA: Diagnosis not present

## 2019-11-09 DIAGNOSIS — Z9981 Dependence on supplemental oxygen: Secondary | ICD-10-CM | POA: Diagnosis not present

## 2019-11-11 DIAGNOSIS — I5089 Other heart failure: Secondary | ICD-10-CM | POA: Diagnosis not present

## 2019-11-11 DIAGNOSIS — U071 COVID-19: Secondary | ICD-10-CM | POA: Diagnosis not present

## 2019-11-11 DIAGNOSIS — J441 Chronic obstructive pulmonary disease with (acute) exacerbation: Secondary | ICD-10-CM | POA: Diagnosis not present

## 2019-11-11 DIAGNOSIS — R269 Unspecified abnormalities of gait and mobility: Secondary | ICD-10-CM | POA: Diagnosis not present

## 2019-11-11 DIAGNOSIS — Z9981 Dependence on supplemental oxygen: Secondary | ICD-10-CM | POA: Diagnosis not present

## 2019-11-15 DIAGNOSIS — I5089 Other heart failure: Secondary | ICD-10-CM | POA: Diagnosis not present

## 2019-11-15 DIAGNOSIS — R269 Unspecified abnormalities of gait and mobility: Secondary | ICD-10-CM | POA: Diagnosis not present

## 2019-11-15 DIAGNOSIS — Z9981 Dependence on supplemental oxygen: Secondary | ICD-10-CM | POA: Diagnosis not present

## 2019-11-15 DIAGNOSIS — U071 COVID-19: Secondary | ICD-10-CM | POA: Diagnosis not present

## 2019-11-15 DIAGNOSIS — J441 Chronic obstructive pulmonary disease with (acute) exacerbation: Secondary | ICD-10-CM | POA: Diagnosis not present

## 2019-11-16 DIAGNOSIS — J441 Chronic obstructive pulmonary disease with (acute) exacerbation: Secondary | ICD-10-CM | POA: Diagnosis not present

## 2019-11-16 DIAGNOSIS — U071 COVID-19: Secondary | ICD-10-CM | POA: Diagnosis not present

## 2019-11-16 DIAGNOSIS — R269 Unspecified abnormalities of gait and mobility: Secondary | ICD-10-CM | POA: Diagnosis not present

## 2019-11-16 DIAGNOSIS — I5089 Other heart failure: Secondary | ICD-10-CM | POA: Diagnosis not present

## 2019-11-16 DIAGNOSIS — Z9981 Dependence on supplemental oxygen: Secondary | ICD-10-CM | POA: Diagnosis not present

## 2019-11-17 DIAGNOSIS — J441 Chronic obstructive pulmonary disease with (acute) exacerbation: Secondary | ICD-10-CM | POA: Diagnosis not present

## 2019-11-17 DIAGNOSIS — I5089 Other heart failure: Secondary | ICD-10-CM | POA: Diagnosis not present

## 2019-11-17 DIAGNOSIS — R269 Unspecified abnormalities of gait and mobility: Secondary | ICD-10-CM | POA: Diagnosis not present

## 2019-11-17 DIAGNOSIS — Z9981 Dependence on supplemental oxygen: Secondary | ICD-10-CM | POA: Diagnosis not present

## 2019-11-17 DIAGNOSIS — U071 COVID-19: Secondary | ICD-10-CM | POA: Diagnosis not present

## 2019-11-18 DIAGNOSIS — J449 Chronic obstructive pulmonary disease, unspecified: Secondary | ICD-10-CM | POA: Diagnosis not present

## 2019-11-19 DIAGNOSIS — Z9981 Dependence on supplemental oxygen: Secondary | ICD-10-CM | POA: Diagnosis not present

## 2019-11-19 DIAGNOSIS — I5089 Other heart failure: Secondary | ICD-10-CM | POA: Diagnosis not present

## 2019-11-19 DIAGNOSIS — J441 Chronic obstructive pulmonary disease with (acute) exacerbation: Secondary | ICD-10-CM | POA: Diagnosis not present

## 2019-11-23 DIAGNOSIS — Z9981 Dependence on supplemental oxygen: Secondary | ICD-10-CM | POA: Diagnosis not present

## 2019-11-23 DIAGNOSIS — U071 COVID-19: Secondary | ICD-10-CM | POA: Diagnosis not present

## 2019-11-23 DIAGNOSIS — R269 Unspecified abnormalities of gait and mobility: Secondary | ICD-10-CM | POA: Diagnosis not present

## 2019-11-23 DIAGNOSIS — J441 Chronic obstructive pulmonary disease with (acute) exacerbation: Secondary | ICD-10-CM | POA: Diagnosis not present

## 2019-11-23 DIAGNOSIS — I5089 Other heart failure: Secondary | ICD-10-CM | POA: Diagnosis not present

## 2019-11-25 DIAGNOSIS — J441 Chronic obstructive pulmonary disease with (acute) exacerbation: Secondary | ICD-10-CM | POA: Diagnosis not present

## 2019-11-25 DIAGNOSIS — R269 Unspecified abnormalities of gait and mobility: Secondary | ICD-10-CM | POA: Diagnosis not present

## 2019-11-25 DIAGNOSIS — U071 COVID-19: Secondary | ICD-10-CM | POA: Diagnosis not present

## 2019-11-25 DIAGNOSIS — Z9981 Dependence on supplemental oxygen: Secondary | ICD-10-CM | POA: Diagnosis not present

## 2019-11-25 DIAGNOSIS — I5089 Other heart failure: Secondary | ICD-10-CM | POA: Diagnosis not present

## 2019-12-01 DIAGNOSIS — J449 Chronic obstructive pulmonary disease, unspecified: Secondary | ICD-10-CM | POA: Diagnosis not present

## 2019-12-07 DIAGNOSIS — J441 Chronic obstructive pulmonary disease with (acute) exacerbation: Secondary | ICD-10-CM | POA: Diagnosis not present

## 2019-12-07 DIAGNOSIS — R269 Unspecified abnormalities of gait and mobility: Secondary | ICD-10-CM | POA: Diagnosis not present

## 2019-12-07 DIAGNOSIS — I5089 Other heart failure: Secondary | ICD-10-CM | POA: Diagnosis not present

## 2019-12-07 DIAGNOSIS — Z9981 Dependence on supplemental oxygen: Secondary | ICD-10-CM | POA: Diagnosis not present

## 2019-12-16 DIAGNOSIS — J449 Chronic obstructive pulmonary disease, unspecified: Secondary | ICD-10-CM | POA: Diagnosis not present

## 2019-12-20 DIAGNOSIS — R269 Unspecified abnormalities of gait and mobility: Secondary | ICD-10-CM | POA: Diagnosis not present

## 2019-12-20 DIAGNOSIS — I5089 Other heart failure: Secondary | ICD-10-CM | POA: Diagnosis not present

## 2019-12-20 DIAGNOSIS — Z9981 Dependence on supplemental oxygen: Secondary | ICD-10-CM | POA: Diagnosis not present

## 2019-12-20 DIAGNOSIS — J441 Chronic obstructive pulmonary disease with (acute) exacerbation: Secondary | ICD-10-CM | POA: Diagnosis not present

## 2019-12-23 ENCOUNTER — Other Ambulatory Visit: Payer: Self-pay

## 2019-12-27 DIAGNOSIS — R269 Unspecified abnormalities of gait and mobility: Secondary | ICD-10-CM | POA: Diagnosis not present

## 2019-12-27 DIAGNOSIS — J449 Chronic obstructive pulmonary disease, unspecified: Secondary | ICD-10-CM | POA: Diagnosis not present

## 2019-12-27 DIAGNOSIS — I5089 Other heart failure: Secondary | ICD-10-CM | POA: Diagnosis not present

## 2019-12-27 DIAGNOSIS — J441 Chronic obstructive pulmonary disease with (acute) exacerbation: Secondary | ICD-10-CM | POA: Diagnosis not present

## 2019-12-27 DIAGNOSIS — Z9981 Dependence on supplemental oxygen: Secondary | ICD-10-CM | POA: Diagnosis not present

## 2019-12-27 DIAGNOSIS — R0602 Shortness of breath: Secondary | ICD-10-CM | POA: Diagnosis not present

## 2020-01-16 DIAGNOSIS — J449 Chronic obstructive pulmonary disease, unspecified: Secondary | ICD-10-CM | POA: Diagnosis not present

## 2020-01-28 DIAGNOSIS — H401123 Primary open-angle glaucoma, left eye, severe stage: Secondary | ICD-10-CM | POA: Diagnosis not present

## 2020-02-15 DIAGNOSIS — J449 Chronic obstructive pulmonary disease, unspecified: Secondary | ICD-10-CM | POA: Diagnosis not present

## 2020-03-02 DIAGNOSIS — J9612 Chronic respiratory failure with hypercapnia: Secondary | ICD-10-CM | POA: Diagnosis not present

## 2020-03-02 DIAGNOSIS — R0600 Dyspnea, unspecified: Secondary | ICD-10-CM | POA: Diagnosis not present

## 2020-03-02 DIAGNOSIS — G4733 Obstructive sleep apnea (adult) (pediatric): Secondary | ICD-10-CM | POA: Diagnosis not present

## 2020-03-02 DIAGNOSIS — J449 Chronic obstructive pulmonary disease, unspecified: Secondary | ICD-10-CM | POA: Diagnosis not present

## 2020-03-17 DIAGNOSIS — J449 Chronic obstructive pulmonary disease, unspecified: Secondary | ICD-10-CM | POA: Diagnosis not present

## 2020-03-20 DIAGNOSIS — R918 Other nonspecific abnormal finding of lung field: Secondary | ICD-10-CM | POA: Diagnosis not present

## 2020-03-20 DIAGNOSIS — J432 Centrilobular emphysema: Secondary | ICD-10-CM | POA: Diagnosis not present

## 2020-04-16 DIAGNOSIS — J449 Chronic obstructive pulmonary disease, unspecified: Secondary | ICD-10-CM | POA: Diagnosis not present

## 2020-05-08 DIAGNOSIS — H6122 Impacted cerumen, left ear: Secondary | ICD-10-CM | POA: Diagnosis not present

## 2020-05-17 DIAGNOSIS — J449 Chronic obstructive pulmonary disease, unspecified: Secondary | ICD-10-CM | POA: Diagnosis not present

## 2020-06-17 DIAGNOSIS — J449 Chronic obstructive pulmonary disease, unspecified: Secondary | ICD-10-CM | POA: Diagnosis not present

## 2020-06-22 DIAGNOSIS — G4733 Obstructive sleep apnea (adult) (pediatric): Secondary | ICD-10-CM | POA: Diagnosis not present

## 2020-06-22 DIAGNOSIS — J9612 Chronic respiratory failure with hypercapnia: Secondary | ICD-10-CM | POA: Diagnosis not present

## 2020-06-22 DIAGNOSIS — R0600 Dyspnea, unspecified: Secondary | ICD-10-CM | POA: Diagnosis not present

## 2020-06-22 DIAGNOSIS — J449 Chronic obstructive pulmonary disease, unspecified: Secondary | ICD-10-CM | POA: Diagnosis not present

## 2020-06-27 DIAGNOSIS — Z13220 Encounter for screening for lipoid disorders: Secondary | ICD-10-CM | POA: Diagnosis not present

## 2020-06-27 DIAGNOSIS — Z23 Encounter for immunization: Secondary | ICD-10-CM | POA: Diagnosis not present

## 2020-06-30 DIAGNOSIS — H401123 Primary open-angle glaucoma, left eye, severe stage: Secondary | ICD-10-CM | POA: Diagnosis not present

## 2020-07-17 DIAGNOSIS — J449 Chronic obstructive pulmonary disease, unspecified: Secondary | ICD-10-CM | POA: Diagnosis not present

## 2020-07-26 DIAGNOSIS — Z23 Encounter for immunization: Secondary | ICD-10-CM | POA: Diagnosis not present

## 2020-08-17 DIAGNOSIS — J449 Chronic obstructive pulmonary disease, unspecified: Secondary | ICD-10-CM | POA: Diagnosis not present

## 2020-09-16 DIAGNOSIS — J449 Chronic obstructive pulmonary disease, unspecified: Secondary | ICD-10-CM | POA: Diagnosis not present

## 2020-09-26 DIAGNOSIS — J9811 Atelectasis: Secondary | ICD-10-CM | POA: Diagnosis not present

## 2020-09-26 DIAGNOSIS — I517 Cardiomegaly: Secondary | ICD-10-CM | POA: Diagnosis not present

## 2020-09-26 DIAGNOSIS — Z79899 Other long term (current) drug therapy: Secondary | ICD-10-CM | POA: Diagnosis not present

## 2020-09-26 DIAGNOSIS — J90 Pleural effusion, not elsewhere classified: Secondary | ICD-10-CM | POA: Diagnosis not present

## 2020-09-26 DIAGNOSIS — Z7982 Long term (current) use of aspirin: Secondary | ICD-10-CM | POA: Diagnosis not present

## 2020-09-26 DIAGNOSIS — R0602 Shortness of breath: Secondary | ICD-10-CM | POA: Diagnosis not present

## 2020-09-26 DIAGNOSIS — F17210 Nicotine dependence, cigarettes, uncomplicated: Secondary | ICD-10-CM | POA: Diagnosis not present

## 2020-09-26 DIAGNOSIS — I110 Hypertensive heart disease with heart failure: Secondary | ICD-10-CM | POA: Diagnosis not present

## 2020-09-26 DIAGNOSIS — F17219 Nicotine dependence, cigarettes, with unspecified nicotine-induced disorders: Secondary | ICD-10-CM | POA: Diagnosis not present

## 2020-09-26 DIAGNOSIS — J441 Chronic obstructive pulmonary disease with (acute) exacerbation: Secondary | ICD-10-CM | POA: Diagnosis not present

## 2020-09-26 DIAGNOSIS — I509 Heart failure, unspecified: Secondary | ICD-10-CM | POA: Diagnosis not present

## 2020-09-26 DIAGNOSIS — R9431 Abnormal electrocardiogram [ECG] [EKG]: Secondary | ICD-10-CM | POA: Diagnosis not present

## 2020-09-26 DIAGNOSIS — Z7952 Long term (current) use of systemic steroids: Secondary | ICD-10-CM | POA: Diagnosis not present

## 2020-09-26 DIAGNOSIS — J449 Chronic obstructive pulmonary disease, unspecified: Secondary | ICD-10-CM | POA: Diagnosis not present

## 2020-09-26 DIAGNOSIS — J9620 Acute and chronic respiratory failure, unspecified whether with hypoxia or hypercapnia: Secondary | ICD-10-CM | POA: Diagnosis not present

## 2020-09-27 DIAGNOSIS — J9620 Acute and chronic respiratory failure, unspecified whether with hypoxia or hypercapnia: Secondary | ICD-10-CM | POA: Diagnosis not present

## 2020-09-27 DIAGNOSIS — I509 Heart failure, unspecified: Secondary | ICD-10-CM | POA: Diagnosis not present

## 2020-09-27 DIAGNOSIS — J441 Chronic obstructive pulmonary disease with (acute) exacerbation: Secondary | ICD-10-CM | POA: Diagnosis not present

## 2020-10-17 DIAGNOSIS — J449 Chronic obstructive pulmonary disease, unspecified: Secondary | ICD-10-CM | POA: Diagnosis not present

## 2020-11-10 IMAGING — DX DG CHEST 1V PORT SAME DAY
1 series · 1 of 1 positions shown · non-contrast
Comparison: 10/27/2019

CLINICAL DATA: 78-year-old male with shortness of breath

EXAM:
PORTABLE CHEST 1 VIEW

[chest]
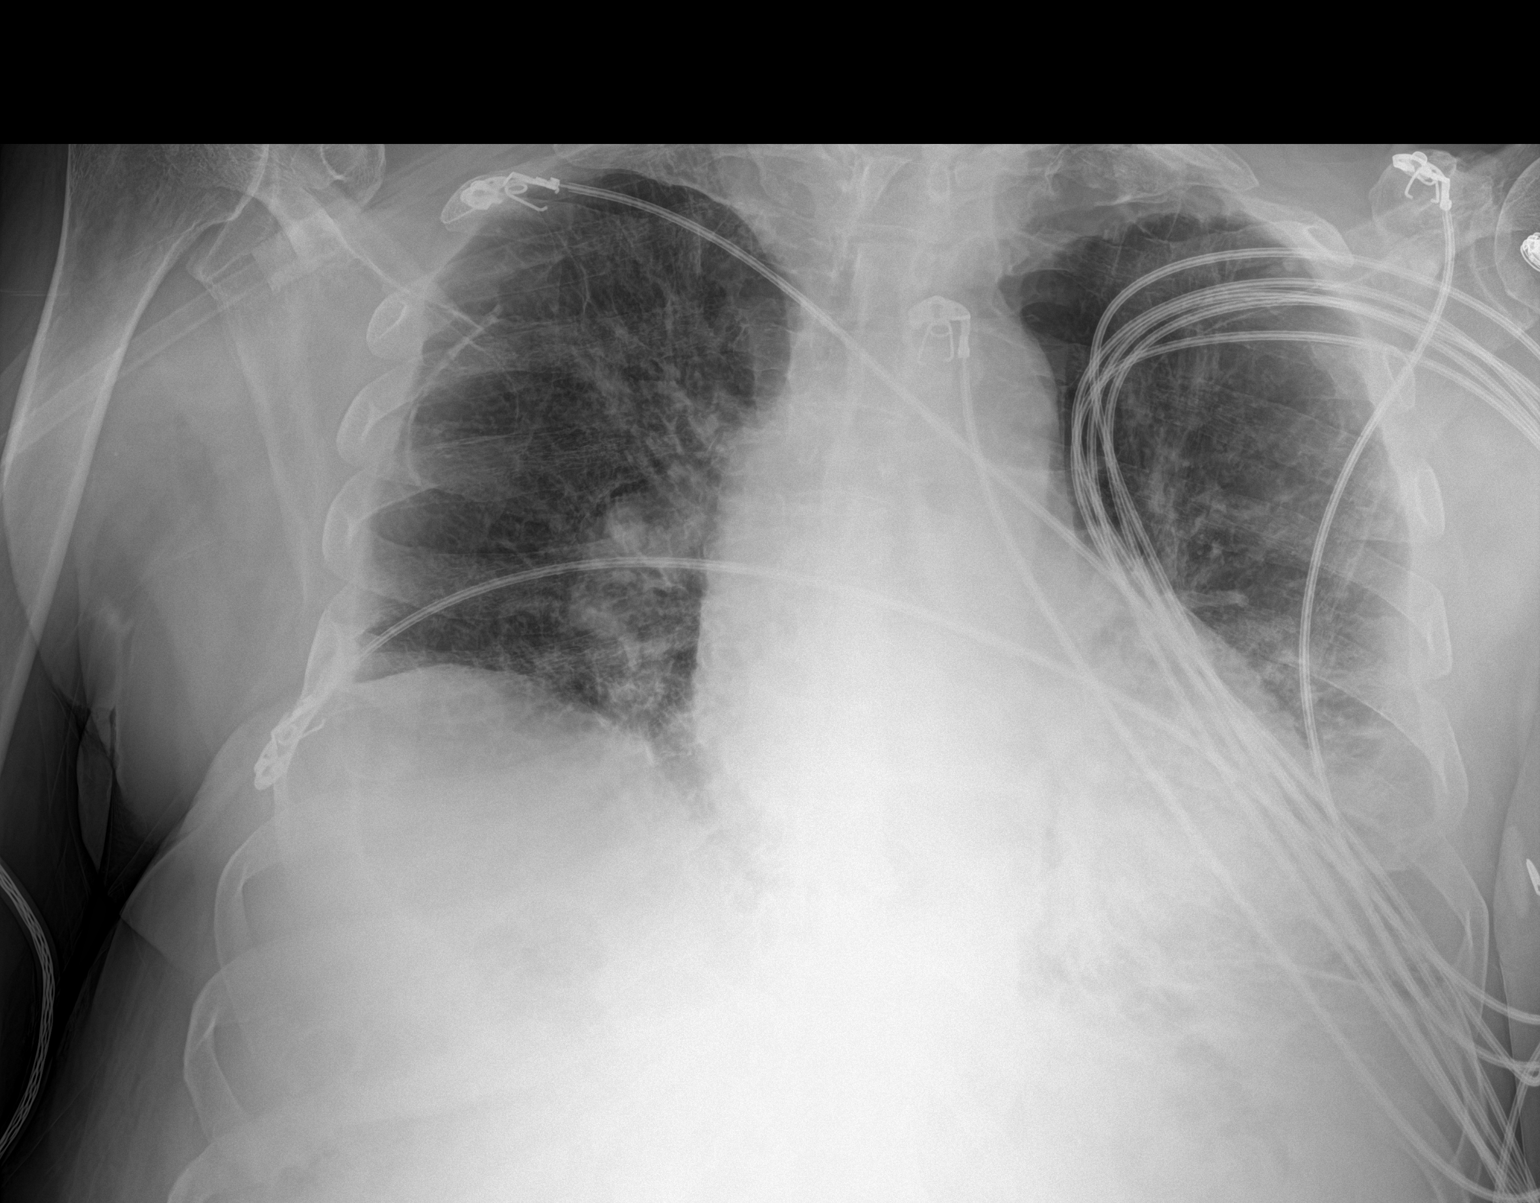

[1 of 1 positions shown; findings below may reference images not displayed]

FINDINGS: Cardiomediastinal silhouette unchanged in size and contour with
cardiomegaly.

Low lung volumes with asymmetric elevation the right hemidiaphragm
persists. No pneumothorax. Reticulonodular opacities of the
bilateral lungs, worse on the left. Overall there is slight
improvement compared to the prior plain film.

No displaced fracture
IMPRESSION: Persisting reticulonodular opacities of the bilateral lungs worst on
the left, compatible with multifocal pneumonia.

## 2020-11-17 DIAGNOSIS — J449 Chronic obstructive pulmonary disease, unspecified: Secondary | ICD-10-CM | POA: Diagnosis not present

## 2020-12-15 DIAGNOSIS — J449 Chronic obstructive pulmonary disease, unspecified: Secondary | ICD-10-CM | POA: Diagnosis not present

## 2020-12-27 DIAGNOSIS — J449 Chronic obstructive pulmonary disease, unspecified: Secondary | ICD-10-CM | POA: Diagnosis not present

## 2020-12-27 DIAGNOSIS — R0602 Shortness of breath: Secondary | ICD-10-CM | POA: Diagnosis not present

## 2021-01-15 DIAGNOSIS — J449 Chronic obstructive pulmonary disease, unspecified: Secondary | ICD-10-CM | POA: Diagnosis not present

## 2021-01-19 DIAGNOSIS — H401123 Primary open-angle glaucoma, left eye, severe stage: Secondary | ICD-10-CM | POA: Diagnosis not present

## 2021-02-01 DIAGNOSIS — J449 Chronic obstructive pulmonary disease, unspecified: Secondary | ICD-10-CM | POA: Diagnosis not present

## 2021-02-01 DIAGNOSIS — R0600 Dyspnea, unspecified: Secondary | ICD-10-CM | POA: Diagnosis not present

## 2021-02-01 DIAGNOSIS — J9612 Chronic respiratory failure with hypercapnia: Secondary | ICD-10-CM | POA: Diagnosis not present

## 2021-02-01 DIAGNOSIS — G4733 Obstructive sleep apnea (adult) (pediatric): Secondary | ICD-10-CM | POA: Diagnosis not present

## 2021-02-14 DIAGNOSIS — J449 Chronic obstructive pulmonary disease, unspecified: Secondary | ICD-10-CM | POA: Diagnosis not present

## 2021-03-17 DIAGNOSIS — J449 Chronic obstructive pulmonary disease, unspecified: Secondary | ICD-10-CM | POA: Diagnosis not present

## 2021-03-19 DIAGNOSIS — J439 Emphysema, unspecified: Secondary | ICD-10-CM | POA: Diagnosis not present

## 2021-03-19 DIAGNOSIS — Z7982 Long term (current) use of aspirin: Secondary | ICD-10-CM | POA: Diagnosis not present

## 2021-03-19 DIAGNOSIS — I5033 Acute on chronic diastolic (congestive) heart failure: Secondary | ICD-10-CM | POA: Diagnosis not present

## 2021-03-19 DIAGNOSIS — Z87891 Personal history of nicotine dependence: Secondary | ICD-10-CM | POA: Diagnosis not present

## 2021-03-19 DIAGNOSIS — R059 Cough, unspecified: Secondary | ICD-10-CM | POA: Diagnosis not present

## 2021-03-19 DIAGNOSIS — I371 Nonrheumatic pulmonary valve insufficiency: Secondary | ICD-10-CM | POA: Diagnosis not present

## 2021-03-19 DIAGNOSIS — I5030 Unspecified diastolic (congestive) heart failure: Secondary | ICD-10-CM | POA: Diagnosis not present

## 2021-03-19 DIAGNOSIS — J441 Chronic obstructive pulmonary disease with (acute) exacerbation: Secondary | ICD-10-CM | POA: Diagnosis not present

## 2021-03-19 DIAGNOSIS — Z7952 Long term (current) use of systemic steroids: Secondary | ICD-10-CM | POA: Diagnosis not present

## 2021-03-19 DIAGNOSIS — I361 Nonrheumatic tricuspid (valve) insufficiency: Secondary | ICD-10-CM | POA: Diagnosis not present

## 2021-03-19 DIAGNOSIS — J9621 Acute and chronic respiratory failure with hypoxia: Secondary | ICD-10-CM | POA: Diagnosis not present

## 2021-03-19 DIAGNOSIS — Z9981 Dependence on supplemental oxygen: Secondary | ICD-10-CM | POA: Diagnosis not present

## 2021-03-19 DIAGNOSIS — J9622 Acute and chronic respiratory failure with hypercapnia: Secondary | ICD-10-CM | POA: Diagnosis not present

## 2021-03-19 DIAGNOSIS — J9602 Acute respiratory failure with hypercapnia: Secondary | ICD-10-CM | POA: Diagnosis not present

## 2021-03-19 DIAGNOSIS — Z79899 Other long term (current) drug therapy: Secondary | ICD-10-CM | POA: Diagnosis not present

## 2021-03-19 DIAGNOSIS — I110 Hypertensive heart disease with heart failure: Secondary | ICD-10-CM | POA: Diagnosis not present

## 2021-03-19 DIAGNOSIS — J811 Chronic pulmonary edema: Secondary | ICD-10-CM | POA: Diagnosis not present

## 2021-03-19 DIAGNOSIS — M1990 Unspecified osteoarthritis, unspecified site: Secondary | ICD-10-CM | POA: Diagnosis not present

## 2021-03-19 DIAGNOSIS — I517 Cardiomegaly: Secondary | ICD-10-CM | POA: Diagnosis not present

## 2021-03-19 DIAGNOSIS — A419 Sepsis, unspecified organism: Secondary | ICD-10-CM | POA: Diagnosis not present

## 2021-03-19 DIAGNOSIS — R0902 Hypoxemia: Secondary | ICD-10-CM | POA: Diagnosis not present

## 2021-03-19 DIAGNOSIS — R0602 Shortness of breath: Secondary | ICD-10-CM | POA: Diagnosis not present

## 2021-03-19 DIAGNOSIS — I4510 Unspecified right bundle-branch block: Secondary | ICD-10-CM | POA: Diagnosis not present

## 2021-03-19 DIAGNOSIS — I10 Essential (primary) hypertension: Secondary | ICD-10-CM | POA: Diagnosis not present

## 2021-03-19 DIAGNOSIS — G4733 Obstructive sleep apnea (adult) (pediatric): Secondary | ICD-10-CM | POA: Diagnosis not present

## 2021-03-19 DIAGNOSIS — J810 Acute pulmonary edema: Secondary | ICD-10-CM | POA: Diagnosis not present

## 2021-03-19 DIAGNOSIS — Z8673 Personal history of transient ischemic attack (TIA), and cerebral infarction without residual deficits: Secondary | ICD-10-CM | POA: Diagnosis not present

## 2021-03-19 DIAGNOSIS — J440 Chronic obstructive pulmonary disease with acute lower respiratory infection: Secondary | ICD-10-CM | POA: Diagnosis not present

## 2021-03-19 DIAGNOSIS — Z743 Need for continuous supervision: Secondary | ICD-10-CM | POA: Diagnosis not present

## 2021-03-19 DIAGNOSIS — J9690 Respiratory failure, unspecified, unspecified whether with hypoxia or hypercapnia: Secondary | ICD-10-CM | POA: Diagnosis not present

## 2021-03-28 DIAGNOSIS — Z87891 Personal history of nicotine dependence: Secondary | ICD-10-CM | POA: Diagnosis not present

## 2021-03-28 DIAGNOSIS — M1990 Unspecified osteoarthritis, unspecified site: Secondary | ICD-10-CM | POA: Diagnosis not present

## 2021-03-28 DIAGNOSIS — Z7902 Long term (current) use of antithrombotics/antiplatelets: Secondary | ICD-10-CM | POA: Diagnosis not present

## 2021-03-28 DIAGNOSIS — G4733 Obstructive sleep apnea (adult) (pediatric): Secondary | ICD-10-CM | POA: Diagnosis not present

## 2021-03-28 DIAGNOSIS — J439 Emphysema, unspecified: Secondary | ICD-10-CM | POA: Diagnosis not present

## 2021-03-28 DIAGNOSIS — K5790 Diverticulosis of intestine, part unspecified, without perforation or abscess without bleeding: Secondary | ICD-10-CM | POA: Diagnosis not present

## 2021-03-28 DIAGNOSIS — Z9981 Dependence on supplemental oxygen: Secondary | ICD-10-CM | POA: Diagnosis not present

## 2021-03-28 DIAGNOSIS — J9621 Acute and chronic respiratory failure with hypoxia: Secondary | ICD-10-CM | POA: Diagnosis not present

## 2021-03-28 DIAGNOSIS — J9622 Acute and chronic respiratory failure with hypercapnia: Secondary | ICD-10-CM | POA: Diagnosis not present

## 2021-03-28 DIAGNOSIS — I5033 Acute on chronic diastolic (congestive) heart failure: Secondary | ICD-10-CM | POA: Diagnosis not present

## 2021-03-28 DIAGNOSIS — Z8673 Personal history of transient ischemic attack (TIA), and cerebral infarction without residual deficits: Secondary | ICD-10-CM | POA: Diagnosis not present

## 2021-03-28 DIAGNOSIS — I110 Hypertensive heart disease with heart failure: Secondary | ICD-10-CM | POA: Diagnosis not present

## 2021-03-28 DIAGNOSIS — E785 Hyperlipidemia, unspecified: Secondary | ICD-10-CM | POA: Diagnosis not present

## 2021-04-01 DIAGNOSIS — K5790 Diverticulosis of intestine, part unspecified, without perforation or abscess without bleeding: Secondary | ICD-10-CM | POA: Diagnosis not present

## 2021-04-01 DIAGNOSIS — J9622 Acute and chronic respiratory failure with hypercapnia: Secondary | ICD-10-CM | POA: Diagnosis not present

## 2021-04-01 DIAGNOSIS — Z9981 Dependence on supplemental oxygen: Secondary | ICD-10-CM | POA: Diagnosis not present

## 2021-04-01 DIAGNOSIS — Z7902 Long term (current) use of antithrombotics/antiplatelets: Secondary | ICD-10-CM | POA: Diagnosis not present

## 2021-04-01 DIAGNOSIS — I110 Hypertensive heart disease with heart failure: Secondary | ICD-10-CM | POA: Diagnosis not present

## 2021-04-01 DIAGNOSIS — G4733 Obstructive sleep apnea (adult) (pediatric): Secondary | ICD-10-CM | POA: Diagnosis not present

## 2021-04-01 DIAGNOSIS — I5033 Acute on chronic diastolic (congestive) heart failure: Secondary | ICD-10-CM | POA: Diagnosis not present

## 2021-04-01 DIAGNOSIS — Z87891 Personal history of nicotine dependence: Secondary | ICD-10-CM | POA: Diagnosis not present

## 2021-04-01 DIAGNOSIS — Z8673 Personal history of transient ischemic attack (TIA), and cerebral infarction without residual deficits: Secondary | ICD-10-CM | POA: Diagnosis not present

## 2021-04-01 DIAGNOSIS — M1990 Unspecified osteoarthritis, unspecified site: Secondary | ICD-10-CM | POA: Diagnosis not present

## 2021-04-01 DIAGNOSIS — J9621 Acute and chronic respiratory failure with hypoxia: Secondary | ICD-10-CM | POA: Diagnosis not present

## 2021-04-01 DIAGNOSIS — E785 Hyperlipidemia, unspecified: Secondary | ICD-10-CM | POA: Diagnosis not present

## 2021-04-01 DIAGNOSIS — J439 Emphysema, unspecified: Secondary | ICD-10-CM | POA: Diagnosis not present

## 2021-04-02 DIAGNOSIS — K5790 Diverticulosis of intestine, part unspecified, without perforation or abscess without bleeding: Secondary | ICD-10-CM | POA: Diagnosis not present

## 2021-04-02 DIAGNOSIS — Z9981 Dependence on supplemental oxygen: Secondary | ICD-10-CM | POA: Diagnosis not present

## 2021-04-02 DIAGNOSIS — Z87891 Personal history of nicotine dependence: Secondary | ICD-10-CM | POA: Diagnosis not present

## 2021-04-02 DIAGNOSIS — I110 Hypertensive heart disease with heart failure: Secondary | ICD-10-CM | POA: Diagnosis not present

## 2021-04-02 DIAGNOSIS — Z8673 Personal history of transient ischemic attack (TIA), and cerebral infarction without residual deficits: Secondary | ICD-10-CM | POA: Diagnosis not present

## 2021-04-02 DIAGNOSIS — Z7902 Long term (current) use of antithrombotics/antiplatelets: Secondary | ICD-10-CM | POA: Diagnosis not present

## 2021-04-02 DIAGNOSIS — G4733 Obstructive sleep apnea (adult) (pediatric): Secondary | ICD-10-CM | POA: Diagnosis not present

## 2021-04-02 DIAGNOSIS — M1990 Unspecified osteoarthritis, unspecified site: Secondary | ICD-10-CM | POA: Diagnosis not present

## 2021-04-02 DIAGNOSIS — I5033 Acute on chronic diastolic (congestive) heart failure: Secondary | ICD-10-CM | POA: Diagnosis not present

## 2021-04-02 DIAGNOSIS — J439 Emphysema, unspecified: Secondary | ICD-10-CM | POA: Diagnosis not present

## 2021-04-02 DIAGNOSIS — J9621 Acute and chronic respiratory failure with hypoxia: Secondary | ICD-10-CM | POA: Diagnosis not present

## 2021-04-02 DIAGNOSIS — J9622 Acute and chronic respiratory failure with hypercapnia: Secondary | ICD-10-CM | POA: Diagnosis not present

## 2021-04-02 DIAGNOSIS — E785 Hyperlipidemia, unspecified: Secondary | ICD-10-CM | POA: Diagnosis not present

## 2021-04-05 DIAGNOSIS — K5790 Diverticulosis of intestine, part unspecified, without perforation or abscess without bleeding: Secondary | ICD-10-CM | POA: Diagnosis not present

## 2021-04-05 DIAGNOSIS — M1990 Unspecified osteoarthritis, unspecified site: Secondary | ICD-10-CM | POA: Diagnosis not present

## 2021-04-05 DIAGNOSIS — J9622 Acute and chronic respiratory failure with hypercapnia: Secondary | ICD-10-CM | POA: Diagnosis not present

## 2021-04-05 DIAGNOSIS — I110 Hypertensive heart disease with heart failure: Secondary | ICD-10-CM | POA: Diagnosis not present

## 2021-04-05 DIAGNOSIS — J9621 Acute and chronic respiratory failure with hypoxia: Secondary | ICD-10-CM | POA: Diagnosis not present

## 2021-04-05 DIAGNOSIS — Z9981 Dependence on supplemental oxygen: Secondary | ICD-10-CM | POA: Diagnosis not present

## 2021-04-05 DIAGNOSIS — E785 Hyperlipidemia, unspecified: Secondary | ICD-10-CM | POA: Diagnosis not present

## 2021-04-05 DIAGNOSIS — J439 Emphysema, unspecified: Secondary | ICD-10-CM | POA: Diagnosis not present

## 2021-04-05 DIAGNOSIS — Z87891 Personal history of nicotine dependence: Secondary | ICD-10-CM | POA: Diagnosis not present

## 2021-04-05 DIAGNOSIS — I5033 Acute on chronic diastolic (congestive) heart failure: Secondary | ICD-10-CM | POA: Diagnosis not present

## 2021-04-05 DIAGNOSIS — Z7902 Long term (current) use of antithrombotics/antiplatelets: Secondary | ICD-10-CM | POA: Diagnosis not present

## 2021-04-05 DIAGNOSIS — G4733 Obstructive sleep apnea (adult) (pediatric): Secondary | ICD-10-CM | POA: Diagnosis not present

## 2021-04-05 DIAGNOSIS — Z8673 Personal history of transient ischemic attack (TIA), and cerebral infarction without residual deficits: Secondary | ICD-10-CM | POA: Diagnosis not present

## 2021-04-09 DIAGNOSIS — I110 Hypertensive heart disease with heart failure: Secondary | ICD-10-CM | POA: Diagnosis not present

## 2021-04-09 DIAGNOSIS — Z8673 Personal history of transient ischemic attack (TIA), and cerebral infarction without residual deficits: Secondary | ICD-10-CM | POA: Diagnosis not present

## 2021-04-09 DIAGNOSIS — J9621 Acute and chronic respiratory failure with hypoxia: Secondary | ICD-10-CM | POA: Diagnosis not present

## 2021-04-09 DIAGNOSIS — E785 Hyperlipidemia, unspecified: Secondary | ICD-10-CM | POA: Diagnosis not present

## 2021-04-09 DIAGNOSIS — I5033 Acute on chronic diastolic (congestive) heart failure: Secondary | ICD-10-CM | POA: Diagnosis not present

## 2021-04-09 DIAGNOSIS — Z7902 Long term (current) use of antithrombotics/antiplatelets: Secondary | ICD-10-CM | POA: Diagnosis not present

## 2021-04-09 DIAGNOSIS — K5790 Diverticulosis of intestine, part unspecified, without perforation or abscess without bleeding: Secondary | ICD-10-CM | POA: Diagnosis not present

## 2021-04-09 DIAGNOSIS — Z9981 Dependence on supplemental oxygen: Secondary | ICD-10-CM | POA: Diagnosis not present

## 2021-04-09 DIAGNOSIS — Z87891 Personal history of nicotine dependence: Secondary | ICD-10-CM | POA: Diagnosis not present

## 2021-04-09 DIAGNOSIS — G4733 Obstructive sleep apnea (adult) (pediatric): Secondary | ICD-10-CM | POA: Diagnosis not present

## 2021-04-09 DIAGNOSIS — J439 Emphysema, unspecified: Secondary | ICD-10-CM | POA: Diagnosis not present

## 2021-04-09 DIAGNOSIS — J9622 Acute and chronic respiratory failure with hypercapnia: Secondary | ICD-10-CM | POA: Diagnosis not present

## 2021-04-09 DIAGNOSIS — M1990 Unspecified osteoarthritis, unspecified site: Secondary | ICD-10-CM | POA: Diagnosis not present

## 2021-04-12 DIAGNOSIS — M1990 Unspecified osteoarthritis, unspecified site: Secondary | ICD-10-CM | POA: Diagnosis not present

## 2021-04-12 DIAGNOSIS — G4733 Obstructive sleep apnea (adult) (pediatric): Secondary | ICD-10-CM | POA: Diagnosis not present

## 2021-04-12 DIAGNOSIS — I110 Hypertensive heart disease with heart failure: Secondary | ICD-10-CM | POA: Diagnosis not present

## 2021-04-12 DIAGNOSIS — J9612 Chronic respiratory failure with hypercapnia: Secondary | ICD-10-CM | POA: Diagnosis not present

## 2021-04-12 DIAGNOSIS — I5033 Acute on chronic diastolic (congestive) heart failure: Secondary | ICD-10-CM | POA: Diagnosis not present

## 2021-04-12 DIAGNOSIS — Z8673 Personal history of transient ischemic attack (TIA), and cerebral infarction without residual deficits: Secondary | ICD-10-CM | POA: Diagnosis not present

## 2021-04-12 DIAGNOSIS — J9621 Acute and chronic respiratory failure with hypoxia: Secondary | ICD-10-CM | POA: Diagnosis not present

## 2021-04-12 DIAGNOSIS — J439 Emphysema, unspecified: Secondary | ICD-10-CM | POA: Diagnosis not present

## 2021-04-12 DIAGNOSIS — J449 Chronic obstructive pulmonary disease, unspecified: Secondary | ICD-10-CM | POA: Diagnosis not present

## 2021-04-12 DIAGNOSIS — E785 Hyperlipidemia, unspecified: Secondary | ICD-10-CM | POA: Diagnosis not present

## 2021-04-12 DIAGNOSIS — Z9981 Dependence on supplemental oxygen: Secondary | ICD-10-CM | POA: Diagnosis not present

## 2021-04-12 DIAGNOSIS — Z7902 Long term (current) use of antithrombotics/antiplatelets: Secondary | ICD-10-CM | POA: Diagnosis not present

## 2021-04-12 DIAGNOSIS — K5790 Diverticulosis of intestine, part unspecified, without perforation or abscess without bleeding: Secondary | ICD-10-CM | POA: Diagnosis not present

## 2021-04-12 DIAGNOSIS — J9622 Acute and chronic respiratory failure with hypercapnia: Secondary | ICD-10-CM | POA: Diagnosis not present

## 2021-04-12 DIAGNOSIS — R0600 Dyspnea, unspecified: Secondary | ICD-10-CM | POA: Diagnosis not present

## 2021-04-12 DIAGNOSIS — Z87891 Personal history of nicotine dependence: Secondary | ICD-10-CM | POA: Diagnosis not present

## 2021-04-13 DIAGNOSIS — J449 Chronic obstructive pulmonary disease, unspecified: Secondary | ICD-10-CM | POA: Diagnosis not present

## 2021-04-16 DIAGNOSIS — K5790 Diverticulosis of intestine, part unspecified, without perforation or abscess without bleeding: Secondary | ICD-10-CM | POA: Diagnosis not present

## 2021-04-16 DIAGNOSIS — Z8673 Personal history of transient ischemic attack (TIA), and cerebral infarction without residual deficits: Secondary | ICD-10-CM | POA: Diagnosis not present

## 2021-04-16 DIAGNOSIS — I110 Hypertensive heart disease with heart failure: Secondary | ICD-10-CM | POA: Diagnosis not present

## 2021-04-16 DIAGNOSIS — J439 Emphysema, unspecified: Secondary | ICD-10-CM | POA: Diagnosis not present

## 2021-04-16 DIAGNOSIS — G4733 Obstructive sleep apnea (adult) (pediatric): Secondary | ICD-10-CM | POA: Diagnosis not present

## 2021-04-16 DIAGNOSIS — E785 Hyperlipidemia, unspecified: Secondary | ICD-10-CM | POA: Diagnosis not present

## 2021-04-16 DIAGNOSIS — I5033 Acute on chronic diastolic (congestive) heart failure: Secondary | ICD-10-CM | POA: Diagnosis not present

## 2021-04-16 DIAGNOSIS — Z7902 Long term (current) use of antithrombotics/antiplatelets: Secondary | ICD-10-CM | POA: Diagnosis not present

## 2021-04-16 DIAGNOSIS — Z87891 Personal history of nicotine dependence: Secondary | ICD-10-CM | POA: Diagnosis not present

## 2021-04-16 DIAGNOSIS — J9621 Acute and chronic respiratory failure with hypoxia: Secondary | ICD-10-CM | POA: Diagnosis not present

## 2021-04-16 DIAGNOSIS — J449 Chronic obstructive pulmonary disease, unspecified: Secondary | ICD-10-CM | POA: Diagnosis not present

## 2021-04-16 DIAGNOSIS — M1990 Unspecified osteoarthritis, unspecified site: Secondary | ICD-10-CM | POA: Diagnosis not present

## 2021-04-16 DIAGNOSIS — Z9981 Dependence on supplemental oxygen: Secondary | ICD-10-CM | POA: Diagnosis not present

## 2021-04-16 DIAGNOSIS — J9622 Acute and chronic respiratory failure with hypercapnia: Secondary | ICD-10-CM | POA: Diagnosis not present

## 2021-04-17 DIAGNOSIS — Z8673 Personal history of transient ischemic attack (TIA), and cerebral infarction without residual deficits: Secondary | ICD-10-CM | POA: Diagnosis not present

## 2021-04-17 DIAGNOSIS — J9622 Acute and chronic respiratory failure with hypercapnia: Secondary | ICD-10-CM | POA: Diagnosis not present

## 2021-04-17 DIAGNOSIS — E785 Hyperlipidemia, unspecified: Secondary | ICD-10-CM | POA: Diagnosis not present

## 2021-04-17 DIAGNOSIS — I5033 Acute on chronic diastolic (congestive) heart failure: Secondary | ICD-10-CM | POA: Diagnosis not present

## 2021-04-17 DIAGNOSIS — J9621 Acute and chronic respiratory failure with hypoxia: Secondary | ICD-10-CM | POA: Diagnosis not present

## 2021-04-17 DIAGNOSIS — K5790 Diverticulosis of intestine, part unspecified, without perforation or abscess without bleeding: Secondary | ICD-10-CM | POA: Diagnosis not present

## 2021-04-17 DIAGNOSIS — Z87891 Personal history of nicotine dependence: Secondary | ICD-10-CM | POA: Diagnosis not present

## 2021-04-17 DIAGNOSIS — G4733 Obstructive sleep apnea (adult) (pediatric): Secondary | ICD-10-CM | POA: Diagnosis not present

## 2021-04-17 DIAGNOSIS — I110 Hypertensive heart disease with heart failure: Secondary | ICD-10-CM | POA: Diagnosis not present

## 2021-04-17 DIAGNOSIS — Z9981 Dependence on supplemental oxygen: Secondary | ICD-10-CM | POA: Diagnosis not present

## 2021-04-17 DIAGNOSIS — Z7902 Long term (current) use of antithrombotics/antiplatelets: Secondary | ICD-10-CM | POA: Diagnosis not present

## 2021-04-17 DIAGNOSIS — M1990 Unspecified osteoarthritis, unspecified site: Secondary | ICD-10-CM | POA: Diagnosis not present

## 2021-04-17 DIAGNOSIS — J439 Emphysema, unspecified: Secondary | ICD-10-CM | POA: Diagnosis not present

## 2021-04-19 DIAGNOSIS — G4733 Obstructive sleep apnea (adult) (pediatric): Secondary | ICD-10-CM | POA: Diagnosis not present

## 2021-04-19 DIAGNOSIS — Z8673 Personal history of transient ischemic attack (TIA), and cerebral infarction without residual deficits: Secondary | ICD-10-CM | POA: Diagnosis not present

## 2021-04-19 DIAGNOSIS — E785 Hyperlipidemia, unspecified: Secondary | ICD-10-CM | POA: Diagnosis not present

## 2021-04-19 DIAGNOSIS — Z87891 Personal history of nicotine dependence: Secondary | ICD-10-CM | POA: Diagnosis not present

## 2021-04-19 DIAGNOSIS — Z7902 Long term (current) use of antithrombotics/antiplatelets: Secondary | ICD-10-CM | POA: Diagnosis not present

## 2021-04-19 DIAGNOSIS — J9621 Acute and chronic respiratory failure with hypoxia: Secondary | ICD-10-CM | POA: Diagnosis not present

## 2021-04-19 DIAGNOSIS — M1990 Unspecified osteoarthritis, unspecified site: Secondary | ICD-10-CM | POA: Diagnosis not present

## 2021-04-19 DIAGNOSIS — J9622 Acute and chronic respiratory failure with hypercapnia: Secondary | ICD-10-CM | POA: Diagnosis not present

## 2021-04-19 DIAGNOSIS — I5033 Acute on chronic diastolic (congestive) heart failure: Secondary | ICD-10-CM | POA: Diagnosis not present

## 2021-04-19 DIAGNOSIS — J439 Emphysema, unspecified: Secondary | ICD-10-CM | POA: Diagnosis not present

## 2021-04-19 DIAGNOSIS — K5790 Diverticulosis of intestine, part unspecified, without perforation or abscess without bleeding: Secondary | ICD-10-CM | POA: Diagnosis not present

## 2021-04-19 DIAGNOSIS — I110 Hypertensive heart disease with heart failure: Secondary | ICD-10-CM | POA: Diagnosis not present

## 2021-04-19 DIAGNOSIS — Z9981 Dependence on supplemental oxygen: Secondary | ICD-10-CM | POA: Diagnosis not present

## 2021-04-23 DIAGNOSIS — I110 Hypertensive heart disease with heart failure: Secondary | ICD-10-CM | POA: Diagnosis not present

## 2021-04-23 DIAGNOSIS — J439 Emphysema, unspecified: Secondary | ICD-10-CM | POA: Diagnosis not present

## 2021-04-23 DIAGNOSIS — Z7902 Long term (current) use of antithrombotics/antiplatelets: Secondary | ICD-10-CM | POA: Diagnosis not present

## 2021-04-23 DIAGNOSIS — Z87891 Personal history of nicotine dependence: Secondary | ICD-10-CM | POA: Diagnosis not present

## 2021-04-23 DIAGNOSIS — Z9981 Dependence on supplemental oxygen: Secondary | ICD-10-CM | POA: Diagnosis not present

## 2021-04-23 DIAGNOSIS — Z8673 Personal history of transient ischemic attack (TIA), and cerebral infarction without residual deficits: Secondary | ICD-10-CM | POA: Diagnosis not present

## 2021-04-23 DIAGNOSIS — I5033 Acute on chronic diastolic (congestive) heart failure: Secondary | ICD-10-CM | POA: Diagnosis not present

## 2021-04-23 DIAGNOSIS — J9621 Acute and chronic respiratory failure with hypoxia: Secondary | ICD-10-CM | POA: Diagnosis not present

## 2021-04-23 DIAGNOSIS — J9622 Acute and chronic respiratory failure with hypercapnia: Secondary | ICD-10-CM | POA: Diagnosis not present

## 2021-04-23 DIAGNOSIS — K5790 Diverticulosis of intestine, part unspecified, without perforation or abscess without bleeding: Secondary | ICD-10-CM | POA: Diagnosis not present

## 2021-04-23 DIAGNOSIS — M1990 Unspecified osteoarthritis, unspecified site: Secondary | ICD-10-CM | POA: Diagnosis not present

## 2021-04-23 DIAGNOSIS — G4733 Obstructive sleep apnea (adult) (pediatric): Secondary | ICD-10-CM | POA: Diagnosis not present

## 2021-04-23 DIAGNOSIS — E785 Hyperlipidemia, unspecified: Secondary | ICD-10-CM | POA: Diagnosis not present

## 2021-04-25 DIAGNOSIS — J9621 Acute and chronic respiratory failure with hypoxia: Secondary | ICD-10-CM | POA: Diagnosis not present

## 2021-04-25 DIAGNOSIS — J439 Emphysema, unspecified: Secondary | ICD-10-CM | POA: Diagnosis not present

## 2021-04-25 DIAGNOSIS — J9622 Acute and chronic respiratory failure with hypercapnia: Secondary | ICD-10-CM | POA: Diagnosis not present

## 2021-04-27 DIAGNOSIS — I5033 Acute on chronic diastolic (congestive) heart failure: Secondary | ICD-10-CM | POA: Diagnosis not present

## 2021-04-27 DIAGNOSIS — K5790 Diverticulosis of intestine, part unspecified, without perforation or abscess without bleeding: Secondary | ICD-10-CM | POA: Diagnosis not present

## 2021-04-27 DIAGNOSIS — J9621 Acute and chronic respiratory failure with hypoxia: Secondary | ICD-10-CM | POA: Diagnosis not present

## 2021-04-27 DIAGNOSIS — Z87891 Personal history of nicotine dependence: Secondary | ICD-10-CM | POA: Diagnosis not present

## 2021-04-27 DIAGNOSIS — Z8673 Personal history of transient ischemic attack (TIA), and cerebral infarction without residual deficits: Secondary | ICD-10-CM | POA: Diagnosis not present

## 2021-04-27 DIAGNOSIS — Z9981 Dependence on supplemental oxygen: Secondary | ICD-10-CM | POA: Diagnosis not present

## 2021-04-27 DIAGNOSIS — M1990 Unspecified osteoarthritis, unspecified site: Secondary | ICD-10-CM | POA: Diagnosis not present

## 2021-04-27 DIAGNOSIS — J9622 Acute and chronic respiratory failure with hypercapnia: Secondary | ICD-10-CM | POA: Diagnosis not present

## 2021-04-27 DIAGNOSIS — J439 Emphysema, unspecified: Secondary | ICD-10-CM | POA: Diagnosis not present

## 2021-04-27 DIAGNOSIS — Z7902 Long term (current) use of antithrombotics/antiplatelets: Secondary | ICD-10-CM | POA: Diagnosis not present

## 2021-04-27 DIAGNOSIS — G4733 Obstructive sleep apnea (adult) (pediatric): Secondary | ICD-10-CM | POA: Diagnosis not present

## 2021-04-27 DIAGNOSIS — I110 Hypertensive heart disease with heart failure: Secondary | ICD-10-CM | POA: Diagnosis not present

## 2021-04-27 DIAGNOSIS — E785 Hyperlipidemia, unspecified: Secondary | ICD-10-CM | POA: Diagnosis not present

## 2021-05-02 DIAGNOSIS — Z7902 Long term (current) use of antithrombotics/antiplatelets: Secondary | ICD-10-CM | POA: Diagnosis not present

## 2021-05-02 DIAGNOSIS — J9621 Acute and chronic respiratory failure with hypoxia: Secondary | ICD-10-CM | POA: Diagnosis not present

## 2021-05-02 DIAGNOSIS — M1990 Unspecified osteoarthritis, unspecified site: Secondary | ICD-10-CM | POA: Diagnosis not present

## 2021-05-02 DIAGNOSIS — J439 Emphysema, unspecified: Secondary | ICD-10-CM | POA: Diagnosis not present

## 2021-05-02 DIAGNOSIS — K5790 Diverticulosis of intestine, part unspecified, without perforation or abscess without bleeding: Secondary | ICD-10-CM | POA: Diagnosis not present

## 2021-05-02 DIAGNOSIS — Z8673 Personal history of transient ischemic attack (TIA), and cerebral infarction without residual deficits: Secondary | ICD-10-CM | POA: Diagnosis not present

## 2021-05-02 DIAGNOSIS — I110 Hypertensive heart disease with heart failure: Secondary | ICD-10-CM | POA: Diagnosis not present

## 2021-05-02 DIAGNOSIS — I5033 Acute on chronic diastolic (congestive) heart failure: Secondary | ICD-10-CM | POA: Diagnosis not present

## 2021-05-02 DIAGNOSIS — Z9981 Dependence on supplemental oxygen: Secondary | ICD-10-CM | POA: Diagnosis not present

## 2021-05-02 DIAGNOSIS — E785 Hyperlipidemia, unspecified: Secondary | ICD-10-CM | POA: Diagnosis not present

## 2021-05-02 DIAGNOSIS — G4733 Obstructive sleep apnea (adult) (pediatric): Secondary | ICD-10-CM | POA: Diagnosis not present

## 2021-05-02 DIAGNOSIS — Z87891 Personal history of nicotine dependence: Secondary | ICD-10-CM | POA: Diagnosis not present

## 2021-05-02 DIAGNOSIS — J9622 Acute and chronic respiratory failure with hypercapnia: Secondary | ICD-10-CM | POA: Diagnosis not present

## 2021-05-07 DIAGNOSIS — Z23 Encounter for immunization: Secondary | ICD-10-CM | POA: Diagnosis not present

## 2021-05-07 DIAGNOSIS — J449 Chronic obstructive pulmonary disease, unspecified: Secondary | ICD-10-CM | POA: Diagnosis not present

## 2021-05-07 DIAGNOSIS — I10 Essential (primary) hypertension: Secondary | ICD-10-CM | POA: Diagnosis not present

## 2021-05-09 DIAGNOSIS — J449 Chronic obstructive pulmonary disease, unspecified: Secondary | ICD-10-CM | POA: Diagnosis not present

## 2021-05-09 DIAGNOSIS — J439 Emphysema, unspecified: Secondary | ICD-10-CM | POA: Diagnosis not present

## 2021-05-09 DIAGNOSIS — J9622 Acute and chronic respiratory failure with hypercapnia: Secondary | ICD-10-CM | POA: Diagnosis not present

## 2021-05-09 DIAGNOSIS — Z87891 Personal history of nicotine dependence: Secondary | ICD-10-CM | POA: Diagnosis not present

## 2021-05-09 DIAGNOSIS — J9621 Acute and chronic respiratory failure with hypoxia: Secondary | ICD-10-CM | POA: Diagnosis not present

## 2021-05-09 DIAGNOSIS — E785 Hyperlipidemia, unspecified: Secondary | ICD-10-CM | POA: Diagnosis not present

## 2021-05-09 DIAGNOSIS — K5790 Diverticulosis of intestine, part unspecified, without perforation or abscess without bleeding: Secondary | ICD-10-CM | POA: Diagnosis not present

## 2021-05-09 DIAGNOSIS — I110 Hypertensive heart disease with heart failure: Secondary | ICD-10-CM | POA: Diagnosis not present

## 2021-05-09 DIAGNOSIS — Z7902 Long term (current) use of antithrombotics/antiplatelets: Secondary | ICD-10-CM | POA: Diagnosis not present

## 2021-05-09 DIAGNOSIS — Z9981 Dependence on supplemental oxygen: Secondary | ICD-10-CM | POA: Diagnosis not present

## 2021-05-09 DIAGNOSIS — G4733 Obstructive sleep apnea (adult) (pediatric): Secondary | ICD-10-CM | POA: Diagnosis not present

## 2021-05-09 DIAGNOSIS — Z8673 Personal history of transient ischemic attack (TIA), and cerebral infarction without residual deficits: Secondary | ICD-10-CM | POA: Diagnosis not present

## 2021-05-09 DIAGNOSIS — M1990 Unspecified osteoarthritis, unspecified site: Secondary | ICD-10-CM | POA: Diagnosis not present

## 2021-05-09 DIAGNOSIS — I5033 Acute on chronic diastolic (congestive) heart failure: Secondary | ICD-10-CM | POA: Diagnosis not present

## 2021-05-10 DIAGNOSIS — J449 Chronic obstructive pulmonary disease, unspecified: Secondary | ICD-10-CM | POA: Diagnosis not present

## 2021-05-17 DIAGNOSIS — J9621 Acute and chronic respiratory failure with hypoxia: Secondary | ICD-10-CM | POA: Diagnosis not present

## 2021-05-17 DIAGNOSIS — Z8673 Personal history of transient ischemic attack (TIA), and cerebral infarction without residual deficits: Secondary | ICD-10-CM | POA: Diagnosis not present

## 2021-05-17 DIAGNOSIS — I5033 Acute on chronic diastolic (congestive) heart failure: Secondary | ICD-10-CM | POA: Diagnosis not present

## 2021-05-17 DIAGNOSIS — J439 Emphysema, unspecified: Secondary | ICD-10-CM | POA: Diagnosis not present

## 2021-05-17 DIAGNOSIS — Z87891 Personal history of nicotine dependence: Secondary | ICD-10-CM | POA: Diagnosis not present

## 2021-05-17 DIAGNOSIS — I110 Hypertensive heart disease with heart failure: Secondary | ICD-10-CM | POA: Diagnosis not present

## 2021-05-17 DIAGNOSIS — G4733 Obstructive sleep apnea (adult) (pediatric): Secondary | ICD-10-CM | POA: Diagnosis not present

## 2021-05-17 DIAGNOSIS — J9622 Acute and chronic respiratory failure with hypercapnia: Secondary | ICD-10-CM | POA: Diagnosis not present

## 2021-05-17 DIAGNOSIS — M1990 Unspecified osteoarthritis, unspecified site: Secondary | ICD-10-CM | POA: Diagnosis not present

## 2021-05-17 DIAGNOSIS — K5790 Diverticulosis of intestine, part unspecified, without perforation or abscess without bleeding: Secondary | ICD-10-CM | POA: Diagnosis not present

## 2021-05-17 DIAGNOSIS — E785 Hyperlipidemia, unspecified: Secondary | ICD-10-CM | POA: Diagnosis not present

## 2021-05-17 DIAGNOSIS — Z9981 Dependence on supplemental oxygen: Secondary | ICD-10-CM | POA: Diagnosis not present

## 2021-05-17 DIAGNOSIS — J449 Chronic obstructive pulmonary disease, unspecified: Secondary | ICD-10-CM | POA: Diagnosis not present

## 2021-05-17 DIAGNOSIS — Z7902 Long term (current) use of antithrombotics/antiplatelets: Secondary | ICD-10-CM | POA: Diagnosis not present

## 2021-05-23 DIAGNOSIS — G4733 Obstructive sleep apnea (adult) (pediatric): Secondary | ICD-10-CM | POA: Diagnosis not present

## 2021-05-23 DIAGNOSIS — K5790 Diverticulosis of intestine, part unspecified, without perforation or abscess without bleeding: Secondary | ICD-10-CM | POA: Diagnosis not present

## 2021-05-23 DIAGNOSIS — I5033 Acute on chronic diastolic (congestive) heart failure: Secondary | ICD-10-CM | POA: Diagnosis not present

## 2021-05-23 DIAGNOSIS — I110 Hypertensive heart disease with heart failure: Secondary | ICD-10-CM | POA: Diagnosis not present

## 2021-05-23 DIAGNOSIS — J439 Emphysema, unspecified: Secondary | ICD-10-CM | POA: Diagnosis not present

## 2021-05-23 DIAGNOSIS — J9622 Acute and chronic respiratory failure with hypercapnia: Secondary | ICD-10-CM | POA: Diagnosis not present

## 2021-05-23 DIAGNOSIS — Z87891 Personal history of nicotine dependence: Secondary | ICD-10-CM | POA: Diagnosis not present

## 2021-05-23 DIAGNOSIS — J9621 Acute and chronic respiratory failure with hypoxia: Secondary | ICD-10-CM | POA: Diagnosis not present

## 2021-05-23 DIAGNOSIS — Z9981 Dependence on supplemental oxygen: Secondary | ICD-10-CM | POA: Diagnosis not present

## 2021-05-23 DIAGNOSIS — M1990 Unspecified osteoarthritis, unspecified site: Secondary | ICD-10-CM | POA: Diagnosis not present

## 2021-05-23 DIAGNOSIS — Z7902 Long term (current) use of antithrombotics/antiplatelets: Secondary | ICD-10-CM | POA: Diagnosis not present

## 2021-05-23 DIAGNOSIS — Z8673 Personal history of transient ischemic attack (TIA), and cerebral infarction without residual deficits: Secondary | ICD-10-CM | POA: Diagnosis not present

## 2021-05-23 DIAGNOSIS — E785 Hyperlipidemia, unspecified: Secondary | ICD-10-CM | POA: Diagnosis not present

## 2021-06-10 DIAGNOSIS — J449 Chronic obstructive pulmonary disease, unspecified: Secondary | ICD-10-CM | POA: Diagnosis not present

## 2021-06-17 DIAGNOSIS — J449 Chronic obstructive pulmonary disease, unspecified: Secondary | ICD-10-CM | POA: Diagnosis not present

## 2021-06-29 DIAGNOSIS — H401123 Primary open-angle glaucoma, left eye, severe stage: Secondary | ICD-10-CM | POA: Diagnosis not present

## 2021-07-10 DIAGNOSIS — J449 Chronic obstructive pulmonary disease, unspecified: Secondary | ICD-10-CM | POA: Diagnosis not present

## 2021-07-17 DIAGNOSIS — J449 Chronic obstructive pulmonary disease, unspecified: Secondary | ICD-10-CM | POA: Diagnosis not present

## 2021-08-01 DIAGNOSIS — K4020 Bilateral inguinal hernia, without obstruction or gangrene, not specified as recurrent: Secondary | ICD-10-CM | POA: Diagnosis not present

## 2021-08-01 DIAGNOSIS — J209 Acute bronchitis, unspecified: Secondary | ICD-10-CM | POA: Diagnosis not present

## 2021-08-01 DIAGNOSIS — R300 Dysuria: Secondary | ICD-10-CM | POA: Diagnosis not present

## 2021-08-03 DIAGNOSIS — Z23 Encounter for immunization: Secondary | ICD-10-CM | POA: Diagnosis not present

## 2021-08-03 DIAGNOSIS — K4090 Unilateral inguinal hernia, without obstruction or gangrene, not specified as recurrent: Secondary | ICD-10-CM | POA: Diagnosis not present

## 2021-08-10 DIAGNOSIS — J449 Chronic obstructive pulmonary disease, unspecified: Secondary | ICD-10-CM | POA: Diagnosis not present

## 2021-08-17 DIAGNOSIS — J449 Chronic obstructive pulmonary disease, unspecified: Secondary | ICD-10-CM | POA: Diagnosis not present

## 2021-08-21 DIAGNOSIS — H6122 Impacted cerumen, left ear: Secondary | ICD-10-CM | POA: Diagnosis not present

## 2021-09-09 DIAGNOSIS — J449 Chronic obstructive pulmonary disease, unspecified: Secondary | ICD-10-CM | POA: Diagnosis not present

## 2021-09-16 DIAGNOSIS — J449 Chronic obstructive pulmonary disease, unspecified: Secondary | ICD-10-CM | POA: Diagnosis not present

## 2021-10-01 DIAGNOSIS — Z9981 Dependence on supplemental oxygen: Secondary | ICD-10-CM | POA: Diagnosis not present

## 2021-10-01 DIAGNOSIS — J9621 Acute and chronic respiratory failure with hypoxia: Secondary | ICD-10-CM | POA: Diagnosis not present

## 2021-10-01 DIAGNOSIS — G9341 Metabolic encephalopathy: Secondary | ICD-10-CM | POA: Diagnosis not present

## 2021-10-01 DIAGNOSIS — I10 Essential (primary) hypertension: Secondary | ICD-10-CM | POA: Diagnosis not present

## 2021-10-01 DIAGNOSIS — R000 Tachycardia, unspecified: Secondary | ICD-10-CM | POA: Diagnosis not present

## 2021-10-01 DIAGNOSIS — Z8673 Personal history of transient ischemic attack (TIA), and cerebral infarction without residual deficits: Secondary | ICD-10-CM | POA: Diagnosis not present

## 2021-10-01 DIAGNOSIS — R0902 Hypoxemia: Secondary | ICD-10-CM | POA: Diagnosis not present

## 2021-10-01 DIAGNOSIS — J9622 Acute and chronic respiratory failure with hypercapnia: Secondary | ICD-10-CM | POA: Diagnosis not present

## 2021-10-01 DIAGNOSIS — R0600 Dyspnea, unspecified: Secondary | ICD-10-CM | POA: Diagnosis not present

## 2021-10-01 DIAGNOSIS — Z87891 Personal history of nicotine dependence: Secondary | ICD-10-CM | POA: Diagnosis not present

## 2021-10-01 DIAGNOSIS — Z792 Long term (current) use of antibiotics: Secondary | ICD-10-CM | POA: Diagnosis not present

## 2021-10-01 DIAGNOSIS — Z7952 Long term (current) use of systemic steroids: Secondary | ICD-10-CM | POA: Diagnosis not present

## 2021-10-01 DIAGNOSIS — J441 Chronic obstructive pulmonary disease with (acute) exacerbation: Secondary | ICD-10-CM | POA: Diagnosis not present

## 2021-10-01 DIAGNOSIS — D649 Anemia, unspecified: Secondary | ICD-10-CM | POA: Diagnosis not present

## 2021-10-01 DIAGNOSIS — G4733 Obstructive sleep apnea (adult) (pediatric): Secondary | ICD-10-CM | POA: Diagnosis not present

## 2021-10-01 DIAGNOSIS — Z79899 Other long term (current) drug therapy: Secondary | ICD-10-CM | POA: Diagnosis not present

## 2021-10-01 DIAGNOSIS — I509 Heart failure, unspecified: Secondary | ICD-10-CM | POA: Diagnosis not present

## 2021-10-01 DIAGNOSIS — I517 Cardiomegaly: Secondary | ICD-10-CM | POA: Diagnosis not present

## 2021-10-01 DIAGNOSIS — I110 Hypertensive heart disease with heart failure: Secondary | ICD-10-CM | POA: Diagnosis not present

## 2021-10-01 DIAGNOSIS — M1990 Unspecified osteoarthritis, unspecified site: Secondary | ICD-10-CM | POA: Diagnosis not present

## 2021-10-01 DIAGNOSIS — J440 Chronic obstructive pulmonary disease with acute lower respiratory infection: Secondary | ICD-10-CM | POA: Diagnosis not present

## 2021-10-01 DIAGNOSIS — Z7982 Long term (current) use of aspirin: Secondary | ICD-10-CM | POA: Diagnosis not present

## 2021-10-02 DIAGNOSIS — G9341 Metabolic encephalopathy: Secondary | ICD-10-CM | POA: Diagnosis not present

## 2021-10-02 DIAGNOSIS — J9621 Acute and chronic respiratory failure with hypoxia: Secondary | ICD-10-CM | POA: Diagnosis not present

## 2021-10-03 DIAGNOSIS — G9341 Metabolic encephalopathy: Secondary | ICD-10-CM | POA: Diagnosis not present

## 2021-10-03 DIAGNOSIS — J9621 Acute and chronic respiratory failure with hypoxia: Secondary | ICD-10-CM | POA: Diagnosis not present

## 2021-10-10 DIAGNOSIS — J449 Chronic obstructive pulmonary disease, unspecified: Secondary | ICD-10-CM | POA: Diagnosis not present

## 2021-10-24 DIAGNOSIS — J9611 Chronic respiratory failure with hypoxia: Secondary | ICD-10-CM | POA: Diagnosis not present

## 2021-10-24 DIAGNOSIS — Z743 Need for continuous supervision: Secondary | ICD-10-CM | POA: Diagnosis not present

## 2021-10-24 DIAGNOSIS — R0602 Shortness of breath: Secondary | ICD-10-CM | POA: Diagnosis not present

## 2021-10-24 DIAGNOSIS — R079 Chest pain, unspecified: Secondary | ICD-10-CM | POA: Diagnosis not present

## 2021-10-24 DIAGNOSIS — Z87891 Personal history of nicotine dependence: Secondary | ICD-10-CM | POA: Diagnosis not present

## 2021-10-24 DIAGNOSIS — R0789 Other chest pain: Secondary | ICD-10-CM | POA: Diagnosis not present

## 2021-10-24 DIAGNOSIS — R069 Unspecified abnormalities of breathing: Secondary | ICD-10-CM | POA: Diagnosis not present

## 2021-10-24 DIAGNOSIS — R0902 Hypoxemia: Secondary | ICD-10-CM | POA: Diagnosis not present

## 2021-10-24 DIAGNOSIS — J9612 Chronic respiratory failure with hypercapnia: Secondary | ICD-10-CM | POA: Diagnosis not present

## 2021-10-25 DIAGNOSIS — J441 Chronic obstructive pulmonary disease with (acute) exacerbation: Secondary | ICD-10-CM | POA: Diagnosis not present

## 2021-10-25 DIAGNOSIS — Z09 Encounter for follow-up examination after completed treatment for conditions other than malignant neoplasm: Secondary | ICD-10-CM | POA: Diagnosis not present

## 2021-11-01 DIAGNOSIS — J441 Chronic obstructive pulmonary disease with (acute) exacerbation: Secondary | ICD-10-CM | POA: Diagnosis not present

## 2021-11-01 DIAGNOSIS — J440 Chronic obstructive pulmonary disease with acute lower respiratory infection: Secondary | ICD-10-CM | POA: Diagnosis not present

## 2021-11-01 DIAGNOSIS — E559 Vitamin D deficiency, unspecified: Secondary | ICD-10-CM | POA: Diagnosis not present

## 2021-11-01 DIAGNOSIS — Z7952 Long term (current) use of systemic steroids: Secondary | ICD-10-CM | POA: Diagnosis not present

## 2021-11-01 DIAGNOSIS — Z7982 Long term (current) use of aspirin: Secondary | ICD-10-CM | POA: Diagnosis not present

## 2021-11-01 DIAGNOSIS — Z87891 Personal history of nicotine dependence: Secondary | ICD-10-CM | POA: Diagnosis not present

## 2021-11-01 DIAGNOSIS — I10 Essential (primary) hypertension: Secondary | ICD-10-CM | POA: Diagnosis not present

## 2021-11-02 DIAGNOSIS — Z7952 Long term (current) use of systemic steroids: Secondary | ICD-10-CM | POA: Diagnosis not present

## 2021-11-02 DIAGNOSIS — Z87891 Personal history of nicotine dependence: Secondary | ICD-10-CM | POA: Diagnosis not present

## 2021-11-02 DIAGNOSIS — Z7982 Long term (current) use of aspirin: Secondary | ICD-10-CM | POA: Diagnosis not present

## 2021-11-02 DIAGNOSIS — E559 Vitamin D deficiency, unspecified: Secondary | ICD-10-CM | POA: Diagnosis not present

## 2021-11-02 DIAGNOSIS — J440 Chronic obstructive pulmonary disease with acute lower respiratory infection: Secondary | ICD-10-CM | POA: Diagnosis not present

## 2021-11-02 DIAGNOSIS — I10 Essential (primary) hypertension: Secondary | ICD-10-CM | POA: Diagnosis not present

## 2021-11-02 DIAGNOSIS — J441 Chronic obstructive pulmonary disease with (acute) exacerbation: Secondary | ICD-10-CM | POA: Diagnosis not present

## 2021-11-06 DIAGNOSIS — J439 Emphysema, unspecified: Secondary | ICD-10-CM | POA: Diagnosis not present

## 2021-11-06 DIAGNOSIS — J9611 Chronic respiratory failure with hypoxia: Secondary | ICD-10-CM | POA: Diagnosis not present

## 2021-11-06 DIAGNOSIS — Z8616 Personal history of COVID-19: Secondary | ICD-10-CM | POA: Diagnosis not present

## 2021-11-06 DIAGNOSIS — J449 Chronic obstructive pulmonary disease, unspecified: Secondary | ICD-10-CM | POA: Diagnosis not present

## 2021-11-06 DIAGNOSIS — R5383 Other fatigue: Secondary | ICD-10-CM | POA: Diagnosis not present

## 2021-11-06 DIAGNOSIS — Z9981 Dependence on supplemental oxygen: Secondary | ICD-10-CM | POA: Diagnosis not present

## 2021-11-06 DIAGNOSIS — R0602 Shortness of breath: Secondary | ICD-10-CM | POA: Diagnosis not present

## 2021-11-08 DIAGNOSIS — Z87891 Personal history of nicotine dependence: Secondary | ICD-10-CM | POA: Diagnosis not present

## 2021-11-08 DIAGNOSIS — I10 Essential (primary) hypertension: Secondary | ICD-10-CM | POA: Diagnosis not present

## 2021-11-08 DIAGNOSIS — J440 Chronic obstructive pulmonary disease with acute lower respiratory infection: Secondary | ICD-10-CM | POA: Diagnosis not present

## 2021-11-08 DIAGNOSIS — J441 Chronic obstructive pulmonary disease with (acute) exacerbation: Secondary | ICD-10-CM | POA: Diagnosis not present

## 2021-11-08 DIAGNOSIS — Z7982 Long term (current) use of aspirin: Secondary | ICD-10-CM | POA: Diagnosis not present

## 2021-11-08 DIAGNOSIS — E559 Vitamin D deficiency, unspecified: Secondary | ICD-10-CM | POA: Diagnosis not present

## 2021-11-08 DIAGNOSIS — Z7952 Long term (current) use of systemic steroids: Secondary | ICD-10-CM | POA: Diagnosis not present

## 2021-11-09 DIAGNOSIS — J441 Chronic obstructive pulmonary disease with (acute) exacerbation: Secondary | ICD-10-CM | POA: Diagnosis not present

## 2021-11-09 DIAGNOSIS — J440 Chronic obstructive pulmonary disease with acute lower respiratory infection: Secondary | ICD-10-CM | POA: Diagnosis not present

## 2021-11-09 DIAGNOSIS — Z7952 Long term (current) use of systemic steroids: Secondary | ICD-10-CM | POA: Diagnosis not present

## 2021-11-09 DIAGNOSIS — Z87891 Personal history of nicotine dependence: Secondary | ICD-10-CM | POA: Diagnosis not present

## 2021-11-09 DIAGNOSIS — I10 Essential (primary) hypertension: Secondary | ICD-10-CM | POA: Diagnosis not present

## 2021-11-09 DIAGNOSIS — E559 Vitamin D deficiency, unspecified: Secondary | ICD-10-CM | POA: Diagnosis not present

## 2021-11-09 DIAGNOSIS — Z7982 Long term (current) use of aspirin: Secondary | ICD-10-CM | POA: Diagnosis not present

## 2021-11-10 DIAGNOSIS — J449 Chronic obstructive pulmonary disease, unspecified: Secondary | ICD-10-CM | POA: Diagnosis not present

## 2021-11-15 DIAGNOSIS — E559 Vitamin D deficiency, unspecified: Secondary | ICD-10-CM | POA: Diagnosis not present

## 2021-11-15 DIAGNOSIS — Z87891 Personal history of nicotine dependence: Secondary | ICD-10-CM | POA: Diagnosis not present

## 2021-11-15 DIAGNOSIS — Z7952 Long term (current) use of systemic steroids: Secondary | ICD-10-CM | POA: Diagnosis not present

## 2021-11-15 DIAGNOSIS — J441 Chronic obstructive pulmonary disease with (acute) exacerbation: Secondary | ICD-10-CM | POA: Diagnosis not present

## 2021-11-15 DIAGNOSIS — Z7982 Long term (current) use of aspirin: Secondary | ICD-10-CM | POA: Diagnosis not present

## 2021-11-15 DIAGNOSIS — I10 Essential (primary) hypertension: Secondary | ICD-10-CM | POA: Diagnosis not present

## 2021-11-15 DIAGNOSIS — J440 Chronic obstructive pulmonary disease with acute lower respiratory infection: Secondary | ICD-10-CM | POA: Diagnosis not present

## 2021-11-21 DIAGNOSIS — E559 Vitamin D deficiency, unspecified: Secondary | ICD-10-CM | POA: Diagnosis not present

## 2021-11-21 DIAGNOSIS — Z7952 Long term (current) use of systemic steroids: Secondary | ICD-10-CM | POA: Diagnosis not present

## 2021-11-21 DIAGNOSIS — J440 Chronic obstructive pulmonary disease with acute lower respiratory infection: Secondary | ICD-10-CM | POA: Diagnosis not present

## 2021-11-21 DIAGNOSIS — J441 Chronic obstructive pulmonary disease with (acute) exacerbation: Secondary | ICD-10-CM | POA: Diagnosis not present

## 2021-11-21 DIAGNOSIS — Z87891 Personal history of nicotine dependence: Secondary | ICD-10-CM | POA: Diagnosis not present

## 2021-11-21 DIAGNOSIS — I10 Essential (primary) hypertension: Secondary | ICD-10-CM | POA: Diagnosis not present

## 2021-11-21 DIAGNOSIS — Z7982 Long term (current) use of aspirin: Secondary | ICD-10-CM | POA: Diagnosis not present

## 2021-11-23 DIAGNOSIS — I10 Essential (primary) hypertension: Secondary | ICD-10-CM | POA: Diagnosis not present

## 2021-11-23 DIAGNOSIS — J440 Chronic obstructive pulmonary disease with acute lower respiratory infection: Secondary | ICD-10-CM | POA: Diagnosis not present

## 2021-11-23 DIAGNOSIS — E559 Vitamin D deficiency, unspecified: Secondary | ICD-10-CM | POA: Diagnosis not present

## 2021-11-23 DIAGNOSIS — Z7952 Long term (current) use of systemic steroids: Secondary | ICD-10-CM | POA: Diagnosis not present

## 2021-11-23 DIAGNOSIS — J441 Chronic obstructive pulmonary disease with (acute) exacerbation: Secondary | ICD-10-CM | POA: Diagnosis not present

## 2021-11-23 DIAGNOSIS — Z87891 Personal history of nicotine dependence: Secondary | ICD-10-CM | POA: Diagnosis not present

## 2021-11-23 DIAGNOSIS — Z7982 Long term (current) use of aspirin: Secondary | ICD-10-CM | POA: Diagnosis not present

## 2021-11-27 DIAGNOSIS — E559 Vitamin D deficiency, unspecified: Secondary | ICD-10-CM | POA: Diagnosis not present

## 2021-11-27 DIAGNOSIS — Z7952 Long term (current) use of systemic steroids: Secondary | ICD-10-CM | POA: Diagnosis not present

## 2021-11-27 DIAGNOSIS — I10 Essential (primary) hypertension: Secondary | ICD-10-CM | POA: Diagnosis not present

## 2021-11-27 DIAGNOSIS — J441 Chronic obstructive pulmonary disease with (acute) exacerbation: Secondary | ICD-10-CM | POA: Diagnosis not present

## 2021-11-27 DIAGNOSIS — Z7982 Long term (current) use of aspirin: Secondary | ICD-10-CM | POA: Diagnosis not present

## 2021-11-27 DIAGNOSIS — Z87891 Personal history of nicotine dependence: Secondary | ICD-10-CM | POA: Diagnosis not present

## 2021-11-27 DIAGNOSIS — J440 Chronic obstructive pulmonary disease with acute lower respiratory infection: Secondary | ICD-10-CM | POA: Diagnosis not present

## 2021-11-29 ENCOUNTER — Ambulatory Visit: Payer: Medicare Other | Admitting: Podiatry

## 2021-11-29 DIAGNOSIS — Z7982 Long term (current) use of aspirin: Secondary | ICD-10-CM | POA: Diagnosis not present

## 2021-11-29 DIAGNOSIS — I10 Essential (primary) hypertension: Secondary | ICD-10-CM | POA: Diagnosis not present

## 2021-11-29 DIAGNOSIS — J440 Chronic obstructive pulmonary disease with acute lower respiratory infection: Secondary | ICD-10-CM | POA: Diagnosis not present

## 2021-11-29 DIAGNOSIS — E559 Vitamin D deficiency, unspecified: Secondary | ICD-10-CM | POA: Diagnosis not present

## 2021-11-29 DIAGNOSIS — J441 Chronic obstructive pulmonary disease with (acute) exacerbation: Secondary | ICD-10-CM | POA: Diagnosis not present

## 2021-11-29 DIAGNOSIS — Z7952 Long term (current) use of systemic steroids: Secondary | ICD-10-CM | POA: Diagnosis not present

## 2021-11-29 DIAGNOSIS — Z87891 Personal history of nicotine dependence: Secondary | ICD-10-CM | POA: Diagnosis not present

## 2021-12-03 DIAGNOSIS — E559 Vitamin D deficiency, unspecified: Secondary | ICD-10-CM | POA: Diagnosis not present

## 2021-12-03 DIAGNOSIS — J440 Chronic obstructive pulmonary disease with acute lower respiratory infection: Secondary | ICD-10-CM | POA: Diagnosis not present

## 2021-12-03 DIAGNOSIS — Z7952 Long term (current) use of systemic steroids: Secondary | ICD-10-CM | POA: Diagnosis not present

## 2021-12-03 DIAGNOSIS — J441 Chronic obstructive pulmonary disease with (acute) exacerbation: Secondary | ICD-10-CM | POA: Diagnosis not present

## 2021-12-03 DIAGNOSIS — Z87891 Personal history of nicotine dependence: Secondary | ICD-10-CM | POA: Diagnosis not present

## 2021-12-03 DIAGNOSIS — I10 Essential (primary) hypertension: Secondary | ICD-10-CM | POA: Diagnosis not present

## 2021-12-03 DIAGNOSIS — Z7982 Long term (current) use of aspirin: Secondary | ICD-10-CM | POA: Diagnosis not present

## 2021-12-05 DIAGNOSIS — J441 Chronic obstructive pulmonary disease with (acute) exacerbation: Secondary | ICD-10-CM | POA: Diagnosis not present

## 2021-12-05 DIAGNOSIS — Z7952 Long term (current) use of systemic steroids: Secondary | ICD-10-CM | POA: Diagnosis not present

## 2021-12-05 DIAGNOSIS — J440 Chronic obstructive pulmonary disease with acute lower respiratory infection: Secondary | ICD-10-CM | POA: Diagnosis not present

## 2021-12-05 DIAGNOSIS — Z87891 Personal history of nicotine dependence: Secondary | ICD-10-CM | POA: Diagnosis not present

## 2021-12-05 DIAGNOSIS — Z7982 Long term (current) use of aspirin: Secondary | ICD-10-CM | POA: Diagnosis not present

## 2021-12-05 DIAGNOSIS — E559 Vitamin D deficiency, unspecified: Secondary | ICD-10-CM | POA: Diagnosis not present

## 2021-12-05 DIAGNOSIS — I10 Essential (primary) hypertension: Secondary | ICD-10-CM | POA: Diagnosis not present

## 2021-12-06 DIAGNOSIS — Z7952 Long term (current) use of systemic steroids: Secondary | ICD-10-CM | POA: Diagnosis not present

## 2021-12-06 DIAGNOSIS — J441 Chronic obstructive pulmonary disease with (acute) exacerbation: Secondary | ICD-10-CM | POA: Diagnosis not present

## 2021-12-06 DIAGNOSIS — E559 Vitamin D deficiency, unspecified: Secondary | ICD-10-CM | POA: Diagnosis not present

## 2021-12-06 DIAGNOSIS — J440 Chronic obstructive pulmonary disease with acute lower respiratory infection: Secondary | ICD-10-CM | POA: Diagnosis not present

## 2021-12-06 DIAGNOSIS — Z87891 Personal history of nicotine dependence: Secondary | ICD-10-CM | POA: Diagnosis not present

## 2021-12-06 DIAGNOSIS — Z7982 Long term (current) use of aspirin: Secondary | ICD-10-CM | POA: Diagnosis not present

## 2021-12-06 DIAGNOSIS — I10 Essential (primary) hypertension: Secondary | ICD-10-CM | POA: Diagnosis not present

## 2021-12-08 DIAGNOSIS — J449 Chronic obstructive pulmonary disease, unspecified: Secondary | ICD-10-CM | POA: Diagnosis not present

## 2021-12-13 DIAGNOSIS — Z87891 Personal history of nicotine dependence: Secondary | ICD-10-CM | POA: Diagnosis not present

## 2021-12-13 DIAGNOSIS — Z7982 Long term (current) use of aspirin: Secondary | ICD-10-CM | POA: Diagnosis not present

## 2021-12-13 DIAGNOSIS — E559 Vitamin D deficiency, unspecified: Secondary | ICD-10-CM | POA: Diagnosis not present

## 2021-12-13 DIAGNOSIS — I10 Essential (primary) hypertension: Secondary | ICD-10-CM | POA: Diagnosis not present

## 2021-12-13 DIAGNOSIS — J440 Chronic obstructive pulmonary disease with acute lower respiratory infection: Secondary | ICD-10-CM | POA: Diagnosis not present

## 2021-12-13 DIAGNOSIS — Z7952 Long term (current) use of systemic steroids: Secondary | ICD-10-CM | POA: Diagnosis not present

## 2021-12-13 DIAGNOSIS — J441 Chronic obstructive pulmonary disease with (acute) exacerbation: Secondary | ICD-10-CM | POA: Diagnosis not present

## 2021-12-19 DIAGNOSIS — E559 Vitamin D deficiency, unspecified: Secondary | ICD-10-CM | POA: Diagnosis not present

## 2021-12-19 DIAGNOSIS — J440 Chronic obstructive pulmonary disease with acute lower respiratory infection: Secondary | ICD-10-CM | POA: Diagnosis not present

## 2021-12-19 DIAGNOSIS — J441 Chronic obstructive pulmonary disease with (acute) exacerbation: Secondary | ICD-10-CM | POA: Diagnosis not present

## 2021-12-19 DIAGNOSIS — Z87891 Personal history of nicotine dependence: Secondary | ICD-10-CM | POA: Diagnosis not present

## 2021-12-19 DIAGNOSIS — Z7952 Long term (current) use of systemic steroids: Secondary | ICD-10-CM | POA: Diagnosis not present

## 2021-12-19 DIAGNOSIS — Z7982 Long term (current) use of aspirin: Secondary | ICD-10-CM | POA: Diagnosis not present

## 2021-12-19 DIAGNOSIS — I10 Essential (primary) hypertension: Secondary | ICD-10-CM | POA: Diagnosis not present

## 2021-12-20 ENCOUNTER — Encounter: Payer: Self-pay | Admitting: Podiatry

## 2021-12-20 ENCOUNTER — Ambulatory Visit: Payer: Medicare Other | Admitting: Podiatry

## 2021-12-20 ENCOUNTER — Other Ambulatory Visit: Payer: Self-pay | Admitting: *Deleted

## 2021-12-20 ENCOUNTER — Other Ambulatory Visit: Payer: Self-pay

## 2021-12-20 DIAGNOSIS — I739 Peripheral vascular disease, unspecified: Secondary | ICD-10-CM

## 2021-12-20 DIAGNOSIS — M79674 Pain in right toe(s): Secondary | ICD-10-CM

## 2021-12-20 DIAGNOSIS — M79675 Pain in left toe(s): Secondary | ICD-10-CM

## 2021-12-20 DIAGNOSIS — M2042 Other hammer toe(s) (acquired), left foot: Secondary | ICD-10-CM

## 2021-12-20 DIAGNOSIS — M2041 Other hammer toe(s) (acquired), right foot: Secondary | ICD-10-CM | POA: Diagnosis not present

## 2021-12-20 DIAGNOSIS — L84 Corns and callosities: Secondary | ICD-10-CM | POA: Diagnosis not present

## 2021-12-20 DIAGNOSIS — B351 Tinea unguium: Secondary | ICD-10-CM

## 2021-12-20 DIAGNOSIS — Q828 Other specified congenital malformations of skin: Secondary | ICD-10-CM | POA: Diagnosis not present

## 2021-12-26 DIAGNOSIS — Z7952 Long term (current) use of systemic steroids: Secondary | ICD-10-CM | POA: Diagnosis not present

## 2021-12-26 DIAGNOSIS — Z87891 Personal history of nicotine dependence: Secondary | ICD-10-CM | POA: Diagnosis not present

## 2021-12-26 DIAGNOSIS — J440 Chronic obstructive pulmonary disease with acute lower respiratory infection: Secondary | ICD-10-CM | POA: Diagnosis not present

## 2021-12-26 DIAGNOSIS — E559 Vitamin D deficiency, unspecified: Secondary | ICD-10-CM | POA: Diagnosis not present

## 2021-12-26 DIAGNOSIS — J441 Chronic obstructive pulmonary disease with (acute) exacerbation: Secondary | ICD-10-CM | POA: Diagnosis not present

## 2021-12-26 DIAGNOSIS — I10 Essential (primary) hypertension: Secondary | ICD-10-CM | POA: Diagnosis not present

## 2021-12-26 DIAGNOSIS — Z7982 Long term (current) use of aspirin: Secondary | ICD-10-CM | POA: Diagnosis not present

## 2021-12-27 NOTE — Progress Notes (Signed)
Subjective: ?Mario Dawson presents today referred by Imagene Riches, NP for complaint of with chief concern of diabetes with elongated, thickened, painful, discolored toenails for month. Aggravating factor(s) include enclosed shoe gear. Patient has tried no treatment.. ? ?He is accompanied by his daughter on today's visit. Patient has h/o COPD and is dependent on supplemental oxygen at all times. ? ?History reviewed. No pertinent past medical history.  ? ?Patient Active Problem List  ? Diagnosis Date Noted  ? Acute respiratory failure with hypoxia ([place]) 10/28/2019  ? COVID-19 virus infection 10/28/2019  ? Chronic diastolic CHF (congestive heart failure) ([place]) 10/28/2019  ? Dyslipidemia 10/28/2019  ? COPD with chronic bronchitis ([place]) 10/28/2019  ? Chronic respiratory failure with hypoxia, on home O2 therapy ([place]) 10/28/2019  ? Prostate cancer ([place]) 10/28/2019  ?  ? ?History reviewed. No pertinent surgical history.  ? ?Current Outpatient Medications on File Prior to Visit  ?Medication Sig Dispense Refill  ? albuterol (PROVENTIL) (2.5 MG/3ML) 0.083% nebulizer solution Take 2.5 mg by nebulization every 6 (six) hours as needed for wheezing or shortness of breath.    ? albuterol (VENTOLIN HFA) 108 (90 Base) MCG/ACT inhaler Inhale 1-2 puffs into the lungs every 4 (four) hours as needed for wheezing or shortness of breath.    ? Ascorbic Acid (VITAMIN C GUMMIES PO) Take 1 tablet by mouth daily.    ? aspirin EC 81 MG tablet Take 81 mg by mouth daily.    ? B Complex Vitamins (VITAMIN B COMPLEX PO) Take 1 tablet by mouth daily.    ? benazepril (LOTENSIN) 20 MG tablet Take 10 mg by mouth daily.    ? cholecalciferol (VITAMIN D-400) 10 MCG (400 UNIT) TABS tablet Take 400 Units by mouth daily.    ? ferrous sulfate 325 (65 FE) MG tablet Take 325 mg by mouth daily with breakfast.    ? fluticasone (FLONASE) 50 MCG/ACT nasal spray Place 1 spray into both nostrils daily.    ? furosemide (LASIX) 20 MG tablet Take 20 mg by mouth  daily.    ? lovastatin (MEVACOR) 10 MG tablet Take 10 mg by mouth daily.    ? LUMIGAN 0.01 % SOLN Place 1 drop into both eyes at bedtime.    ? predniSONE (DELTASONE) 20 MG tablet Take 20 mg by mouth daily with breakfast.    ? TRELEGY ELLIPTA 100-62.5-25 MCG/INH AEPB Inhale 1 puff into the lungs daily.    ? ?No current facility-administered medications on file prior to visit.  ?  ? ?No Known Allergies  ? ?Social History  ? ?Occupational History  ? Not on file  ?Tobacco Use  ? Smoking status: Former  ? Smokeless tobacco: Never  ?Vaping Use  ? Vaping Use: Never used  ?Substance and Sexual Activity  ? Alcohol use: Not Currently  ? Drug use: Never  ? Sexual activity: Not Currently  ?  ? ?History reviewed. No pertinent family history.  ? ? ?There is no immunization history on file for this patient.  ? ?Objective: ?[name] is a pleasant 81 y.o. male morbidly obese in NAD. AAO x 3. ? ?There were no vitals filed for this visit. ? ?Vascular Examination:  ?CFT <4 seconds b/l LE. Palpable DP pulse(s) b/l LE. Diminished PT pulse(s) b/l LE. Pedal hair absent. No pain with calf compression b/l. Lower extremity skin temperature gradient within normal limits. Dependent edema noted b/l LE. No ischemia or gangrene noted b/l LE. No cyanosis or clubbing noted b/l LE. ? ?  Dermatological Examination: ?No open wounds b/l LE. No interdigital macerations noted b/l LE. Toenails 1-5 bilaterally elongated, discolored, dystrophic, thickened, and crumbly with subungual debris and tenderness to dorsal palpation. Hyperkeratotic lesion(s) R 4th toe.  No erythema, no edema, no drainage, no fluctuance. Porokeratotic lesion(s) L 3rd toe. No erythema, no edema, no drainage, no fluctuance. ? ?Musculoskeletal: ?Muscle strength 5/5 to all lower extremity muscle groups bilaterally. Hammertoe deformity noted 2-5 b/l. ? ?Neurological: ?Protective sensation decreased with 10 gram monofilament b/l. Vibratory sensation decreased b/l. ?  ?Assessment: ?1.  Pain due to onychomycosis of toenails of both feet   ?2. Corns   ?3. Porokeratosis   ?4. Acquired hammertoes of both feet   ?5. PAD (peripheral artery disease) ([place])   ?  ?Plan: ?-Patient was evaluated and treated. All patient's and/or POA's questions/concerns answered on today's visit. ?-Discussed topical, laser and oral medication for onychomycosis. Patient opted for toenail debridement only on today.  ?-Mycotic toenails 1-5 bilaterally were debrided in length and girth with sterile nail nippers and dremel without incident. ?-Corn(s) R 4th toe pared utilizing sterile scalpel blade without complication or incident. Total number debrided=1. ?-Painful porokeratotic lesion(s) L 3rd toe pared and enucleated with sterile scalpel blade without incident. Total number of lesions debrided=1. ?-Recommended Skechers shoes with stretchable uppers and memory foam insoles. ?-Patient/POA to call should there be question/concern in the interim. ? ?Return in about 3 months (around 03/22/2022). ? ?Marzetta Board, DPM ?

## 2022-01-08 DIAGNOSIS — Z87891 Personal history of nicotine dependence: Secondary | ICD-10-CM | POA: Diagnosis not present

## 2022-01-08 DIAGNOSIS — J439 Emphysema, unspecified: Secondary | ICD-10-CM | POA: Diagnosis not present

## 2022-01-08 DIAGNOSIS — J449 Chronic obstructive pulmonary disease, unspecified: Secondary | ICD-10-CM | POA: Diagnosis not present

## 2022-01-08 DIAGNOSIS — I700 Atherosclerosis of aorta: Secondary | ICD-10-CM | POA: Diagnosis not present

## 2022-01-08 DIAGNOSIS — R911 Solitary pulmonary nodule: Secondary | ICD-10-CM | POA: Diagnosis not present

## 2022-01-10 DIAGNOSIS — E559 Vitamin D deficiency, unspecified: Secondary | ICD-10-CM | POA: Diagnosis not present

## 2022-01-10 DIAGNOSIS — Z87891 Personal history of nicotine dependence: Secondary | ICD-10-CM | POA: Diagnosis not present

## 2022-01-10 DIAGNOSIS — Z9181 History of falling: Secondary | ICD-10-CM | POA: Diagnosis not present

## 2022-01-10 DIAGNOSIS — E7800 Pure hypercholesterolemia, unspecified: Secondary | ICD-10-CM | POA: Diagnosis not present

## 2022-01-10 DIAGNOSIS — Z7952 Long term (current) use of systemic steroids: Secondary | ICD-10-CM | POA: Diagnosis not present

## 2022-01-10 DIAGNOSIS — J449 Chronic obstructive pulmonary disease, unspecified: Secondary | ICD-10-CM | POA: Diagnosis not present

## 2022-01-10 DIAGNOSIS — Z9981 Dependence on supplemental oxygen: Secondary | ICD-10-CM | POA: Diagnosis not present

## 2022-01-10 DIAGNOSIS — I10 Essential (primary) hypertension: Secondary | ICD-10-CM | POA: Diagnosis not present

## 2022-01-15 DIAGNOSIS — Z9981 Dependence on supplemental oxygen: Secondary | ICD-10-CM | POA: Diagnosis not present

## 2022-01-15 DIAGNOSIS — Z87891 Personal history of nicotine dependence: Secondary | ICD-10-CM | POA: Diagnosis not present

## 2022-01-15 DIAGNOSIS — J449 Chronic obstructive pulmonary disease, unspecified: Secondary | ICD-10-CM | POA: Diagnosis not present

## 2022-01-15 DIAGNOSIS — J9611 Chronic respiratory failure with hypoxia: Secondary | ICD-10-CM | POA: Diagnosis not present

## 2022-01-15 DIAGNOSIS — R911 Solitary pulmonary nodule: Secondary | ICD-10-CM | POA: Diagnosis not present

## 2022-01-28 DIAGNOSIS — I088 Other rheumatic multiple valve diseases: Secondary | ICD-10-CM | POA: Diagnosis not present

## 2022-01-28 DIAGNOSIS — J9622 Acute and chronic respiratory failure with hypercapnia: Secondary | ICD-10-CM | POA: Diagnosis not present

## 2022-01-28 DIAGNOSIS — Z9981 Dependence on supplemental oxygen: Secondary | ICD-10-CM | POA: Diagnosis not present

## 2022-01-28 DIAGNOSIS — I10 Essential (primary) hypertension: Secondary | ICD-10-CM | POA: Diagnosis not present

## 2022-01-28 DIAGNOSIS — G9341 Metabolic encephalopathy: Secondary | ICD-10-CM | POA: Diagnosis not present

## 2022-01-28 DIAGNOSIS — J439 Emphysema, unspecified: Secondary | ICD-10-CM | POA: Diagnosis not present

## 2022-01-28 DIAGNOSIS — I491 Atrial premature depolarization: Secondary | ICD-10-CM | POA: Diagnosis not present

## 2022-01-28 DIAGNOSIS — R0602 Shortness of breath: Secondary | ICD-10-CM | POA: Diagnosis not present

## 2022-01-28 DIAGNOSIS — R490 Dysphonia: Secondary | ICD-10-CM | POA: Diagnosis not present

## 2022-01-28 DIAGNOSIS — R778 Other specified abnormalities of plasma proteins: Secondary | ICD-10-CM | POA: Diagnosis not present

## 2022-01-28 DIAGNOSIS — I252 Old myocardial infarction: Secondary | ICD-10-CM | POA: Diagnosis not present

## 2022-01-28 DIAGNOSIS — I4510 Unspecified right bundle-branch block: Secondary | ICD-10-CM | POA: Diagnosis not present

## 2022-01-28 DIAGNOSIS — Z8616 Personal history of COVID-19: Secondary | ICD-10-CM | POA: Diagnosis not present

## 2022-01-28 DIAGNOSIS — I248 Other forms of acute ischemic heart disease: Secondary | ICD-10-CM | POA: Diagnosis not present

## 2022-01-28 DIAGNOSIS — Z7952 Long term (current) use of systemic steroids: Secondary | ICD-10-CM | POA: Diagnosis not present

## 2022-01-28 DIAGNOSIS — J9621 Acute and chronic respiratory failure with hypoxia: Secondary | ICD-10-CM | POA: Diagnosis not present

## 2022-01-28 DIAGNOSIS — R079 Chest pain, unspecified: Secondary | ICD-10-CM | POA: Diagnosis not present

## 2022-01-28 DIAGNOSIS — Z87891 Personal history of nicotine dependence: Secondary | ICD-10-CM | POA: Diagnosis not present

## 2022-01-28 DIAGNOSIS — J441 Chronic obstructive pulmonary disease with (acute) exacerbation: Secondary | ICD-10-CM | POA: Diagnosis not present

## 2022-01-29 DIAGNOSIS — G9341 Metabolic encephalopathy: Secondary | ICD-10-CM | POA: Diagnosis not present

## 2022-01-29 DIAGNOSIS — R0602 Shortness of breath: Secondary | ICD-10-CM | POA: Diagnosis not present

## 2022-01-29 DIAGNOSIS — Z87891 Personal history of nicotine dependence: Secondary | ICD-10-CM | POA: Diagnosis not present

## 2022-01-29 DIAGNOSIS — J441 Chronic obstructive pulmonary disease with (acute) exacerbation: Secondary | ICD-10-CM | POA: Diagnosis not present

## 2022-01-29 DIAGNOSIS — I10 Essential (primary) hypertension: Secondary | ICD-10-CM | POA: Diagnosis not present

## 2022-01-29 DIAGNOSIS — R748 Abnormal levels of other serum enzymes: Secondary | ICD-10-CM | POA: Diagnosis not present

## 2022-01-29 DIAGNOSIS — I371 Nonrheumatic pulmonary valve insufficiency: Secondary | ICD-10-CM | POA: Diagnosis not present

## 2022-01-29 DIAGNOSIS — J9622 Acute and chronic respiratory failure with hypercapnia: Secondary | ICD-10-CM | POA: Diagnosis not present

## 2022-01-29 DIAGNOSIS — R778 Other specified abnormalities of plasma proteins: Secondary | ICD-10-CM | POA: Diagnosis not present

## 2022-01-29 DIAGNOSIS — J9621 Acute and chronic respiratory failure with hypoxia: Secondary | ICD-10-CM | POA: Diagnosis not present

## 2022-01-29 DIAGNOSIS — I248 Other forms of acute ischemic heart disease: Secondary | ICD-10-CM | POA: Diagnosis not present

## 2022-01-29 DIAGNOSIS — I088 Other rheumatic multiple valve diseases: Secondary | ICD-10-CM | POA: Diagnosis not present

## 2022-01-29 DIAGNOSIS — R490 Dysphonia: Secondary | ICD-10-CM | POA: Diagnosis not present

## 2022-01-29 DIAGNOSIS — Z8616 Personal history of COVID-19: Secondary | ICD-10-CM | POA: Diagnosis not present

## 2022-01-29 DIAGNOSIS — I517 Cardiomegaly: Secondary | ICD-10-CM | POA: Diagnosis not present

## 2022-01-29 DIAGNOSIS — J439 Emphysema, unspecified: Secondary | ICD-10-CM | POA: Diagnosis not present

## 2022-01-29 DIAGNOSIS — I4510 Unspecified right bundle-branch block: Secondary | ICD-10-CM | POA: Diagnosis not present

## 2022-01-29 DIAGNOSIS — I252 Old myocardial infarction: Secondary | ICD-10-CM | POA: Diagnosis not present

## 2022-01-29 DIAGNOSIS — Z9981 Dependence on supplemental oxygen: Secondary | ICD-10-CM | POA: Diagnosis not present

## 2022-01-29 DIAGNOSIS — Z7952 Long term (current) use of systemic steroids: Secondary | ICD-10-CM | POA: Diagnosis not present

## 2022-01-30 DIAGNOSIS — R0602 Shortness of breath: Secondary | ICD-10-CM | POA: Diagnosis not present

## 2022-01-30 DIAGNOSIS — R490 Dysphonia: Secondary | ICD-10-CM | POA: Diagnosis not present

## 2022-01-30 DIAGNOSIS — J9622 Acute and chronic respiratory failure with hypercapnia: Secondary | ICD-10-CM | POA: Diagnosis not present

## 2022-01-30 DIAGNOSIS — I088 Other rheumatic multiple valve diseases: Secondary | ICD-10-CM | POA: Diagnosis not present

## 2022-01-30 DIAGNOSIS — R778 Other specified abnormalities of plasma proteins: Secondary | ICD-10-CM | POA: Diagnosis not present

## 2022-01-30 DIAGNOSIS — I4510 Unspecified right bundle-branch block: Secondary | ICD-10-CM | POA: Diagnosis not present

## 2022-01-30 DIAGNOSIS — J441 Chronic obstructive pulmonary disease with (acute) exacerbation: Secondary | ICD-10-CM | POA: Diagnosis not present

## 2022-01-30 DIAGNOSIS — Z8616 Personal history of COVID-19: Secondary | ICD-10-CM | POA: Diagnosis not present

## 2022-01-30 DIAGNOSIS — I10 Essential (primary) hypertension: Secondary | ICD-10-CM | POA: Diagnosis not present

## 2022-01-30 DIAGNOSIS — G9341 Metabolic encephalopathy: Secondary | ICD-10-CM | POA: Diagnosis not present

## 2022-01-30 DIAGNOSIS — Z9981 Dependence on supplemental oxygen: Secondary | ICD-10-CM | POA: Diagnosis not present

## 2022-01-30 DIAGNOSIS — I252 Old myocardial infarction: Secondary | ICD-10-CM | POA: Diagnosis not present

## 2022-01-30 DIAGNOSIS — J9621 Acute and chronic respiratory failure with hypoxia: Secondary | ICD-10-CM | POA: Diagnosis not present

## 2022-01-30 DIAGNOSIS — I248 Other forms of acute ischemic heart disease: Secondary | ICD-10-CM | POA: Diagnosis not present

## 2022-01-30 DIAGNOSIS — Z87891 Personal history of nicotine dependence: Secondary | ICD-10-CM | POA: Diagnosis not present

## 2022-01-30 DIAGNOSIS — J439 Emphysema, unspecified: Secondary | ICD-10-CM | POA: Diagnosis not present

## 2022-01-30 DIAGNOSIS — Z7952 Long term (current) use of systemic steroids: Secondary | ICD-10-CM | POA: Diagnosis not present

## 2022-02-07 DIAGNOSIS — J449 Chronic obstructive pulmonary disease, unspecified: Secondary | ICD-10-CM | POA: Diagnosis not present

## 2022-03-06 DIAGNOSIS — H401112 Primary open-angle glaucoma, right eye, moderate stage: Secondary | ICD-10-CM | POA: Diagnosis not present

## 2022-03-06 DIAGNOSIS — H47012 Ischemic optic neuropathy, left eye: Secondary | ICD-10-CM | POA: Diagnosis not present

## 2022-03-06 DIAGNOSIS — H401123 Primary open-angle glaucoma, left eye, severe stage: Secondary | ICD-10-CM | POA: Diagnosis not present

## 2022-03-10 DIAGNOSIS — J449 Chronic obstructive pulmonary disease, unspecified: Secondary | ICD-10-CM | POA: Diagnosis not present

## 2022-03-12 DIAGNOSIS — J449 Chronic obstructive pulmonary disease, unspecified: Secondary | ICD-10-CM | POA: Diagnosis not present

## 2022-03-18 DIAGNOSIS — R0602 Shortness of breath: Secondary | ICD-10-CM | POA: Diagnosis not present

## 2022-03-18 DIAGNOSIS — G4700 Insomnia, unspecified: Secondary | ICD-10-CM | POA: Diagnosis not present

## 2022-03-18 DIAGNOSIS — I959 Hypotension, unspecified: Secondary | ICD-10-CM | POA: Diagnosis not present

## 2022-03-18 DIAGNOSIS — E559 Vitamin D deficiency, unspecified: Secondary | ICD-10-CM | POA: Diagnosis not present

## 2022-03-18 DIAGNOSIS — R609 Edema, unspecified: Secondary | ICD-10-CM | POA: Diagnosis not present

## 2022-03-18 DIAGNOSIS — Z79899 Other long term (current) drug therapy: Secondary | ICD-10-CM | POA: Diagnosis not present

## 2022-03-18 DIAGNOSIS — Z5181 Encounter for therapeutic drug level monitoring: Secondary | ICD-10-CM | POA: Diagnosis not present

## 2022-03-18 DIAGNOSIS — I10 Essential (primary) hypertension: Secondary | ICD-10-CM | POA: Diagnosis not present

## 2022-03-18 DIAGNOSIS — D519 Vitamin B12 deficiency anemia, unspecified: Secondary | ICD-10-CM | POA: Diagnosis not present

## 2022-03-18 DIAGNOSIS — J449 Chronic obstructive pulmonary disease, unspecified: Secondary | ICD-10-CM | POA: Diagnosis not present

## 2022-03-28 ENCOUNTER — Ambulatory Visit (INDEPENDENT_AMBULATORY_CARE_PROVIDER_SITE_OTHER): Payer: Self-pay | Admitting: Podiatry

## 2022-03-28 DIAGNOSIS — Z91199 Patient's noncompliance with other medical treatment and regimen due to unspecified reason: Secondary | ICD-10-CM

## 2022-03-28 NOTE — Progress Notes (Signed)
   Complete physical exam  Patient: Mario Dawson   DOB: 07/20/2001   81 y.o. Male  MRN: [id number]  Subjective:    No chief complaint on file.   Mario Dawson is a 81 y.o. male who presents today for a complete physical exam. She reports consuming a {diet types:17450} diet. {types:19826} She generally feels {DESC; WELL/FAIRLY WELL/POORLY:18703}. She reports sleeping {DESC; WELL/FAIRLY WELL/POORLY:18703}. She {does/does not:200015} have additional problems to discuss today.    Most recent fall risk assessment:    03/27/2022   10:42 AM  Fall Risk   Falls in the past year? 0  Number falls in past yr: 0  Injury with Fall? 0  Risk for fall due to : No Fall Risks  Follow up Falls evaluation completed     Most recent depression screenings:    03/27/2022   10:42 AM 02/15/2021   10:46 AM  PHQ 2/9 Scores  PHQ - 2 Score 0 0  PHQ- 9 Score 5     {VISON DENTAL STD PSA (Optional):27386}  {History (Optional):23778}  Patient Care Team: Jessup, Joy, NP as PCP - General (Nurse Practitioner)   Outpatient Medications Prior to Visit  Medication Sig   fluticasone (FLONASE) 50 MCG/ACT nasal spray Place 2 sprays into both nostrils in the morning and at bedtime. After 7 days, reduce to once daily.   norgestimate-ethinyl estradiol (SPRINTEC 28) 0.25-35 MG-MCG tablet Take 1 tablet by mouth daily.   Nystatin POWD Apply liberally to affected area 2 times per day   spironolactone (ALDACTONE) 100 MG tablet Take 1 tablet (100 mg total) by mouth daily.   No facility-administered medications prior to visit.    ROS        Objective:     There were no vitals taken for this visit. {Vitals History (Optional):23777}  Physical Exam   No results found for any visits on 05/02/22. {Show previous labs (optional):23779}    Assessment & Plan:    Routine Health Maintenance and Physical Exam  Immunization History  Administered Date(s) Administered   DTaP 10/03/1999, 11/29/1999,  02/07/2000, 10/23/2000, 05/08/2004   Hepatitis A 03/04/2008, 03/10/2009   Hepatitis B 07/21/1999, 08/28/1999, 02/07/2000   HiB (PRP-OMP) 10/03/1999, 11/29/1999, 02/07/2000, 10/23/2000   IPV 10/03/1999, 11/29/1999, 07/28/2000, 05/08/2004   Influenza,inj,Quad PF,6+ Mos 06/10/2014   Influenza-Unspecified 09/09/2012   MMR 07/28/2001, 05/08/2004   Meningococcal Polysaccharide 03/09/2012   Pneumococcal Conjugate-13 10/23/2000   Pneumococcal-Unspecified 02/07/2000, 04/22/2000   Tdap 03/09/2012   Varicella 07/28/2000, 03/04/2008    Health Maintenance  Topic Date Due   HIV Screening  Never done   Hepatitis C Screening  Never done   INFLUENZA VACCINE  04/30/2022   PAP-Cervical Cytology Screening  05/02/2022 (Originally 07/19/2020)   PAP SMEAR-Modifier  05/02/2022 (Originally 07/19/2020)   TETANUS/TDAP  05/02/2022 (Originally 03/09/2022)   HPV VACCINES  Discontinued   COVID-19 Vaccine  Discontinued    Discussed health benefits of physical activity, and encouraged her to engage in regular exercise appropriate for her age and condition.  Problem List Items Addressed This Visit   None Visit Diagnoses     Annual physical exam    -  Primary   Cervical cancer screening       Need for Tdap vaccination          No follow-ups on file.     Joy Jessup, NP   

## 2022-04-09 DIAGNOSIS — J449 Chronic obstructive pulmonary disease, unspecified: Secondary | ICD-10-CM | POA: Diagnosis not present

## 2022-04-10 DIAGNOSIS — I082 Rheumatic disorders of both aortic and tricuspid valves: Secondary | ICD-10-CM | POA: Diagnosis not present

## 2022-04-10 DIAGNOSIS — R609 Edema, unspecified: Secondary | ICD-10-CM | POA: Diagnosis not present

## 2022-04-10 DIAGNOSIS — I10 Essential (primary) hypertension: Secondary | ICD-10-CM | POA: Diagnosis not present

## 2022-04-10 DIAGNOSIS — I361 Nonrheumatic tricuspid (valve) insufficiency: Secondary | ICD-10-CM | POA: Diagnosis not present

## 2022-04-11 DIAGNOSIS — J449 Chronic obstructive pulmonary disease, unspecified: Secondary | ICD-10-CM | POA: Diagnosis not present

## 2022-05-02 DIAGNOSIS — R799 Abnormal finding of blood chemistry, unspecified: Secondary | ICD-10-CM | POA: Diagnosis not present

## 2022-05-10 DIAGNOSIS — J449 Chronic obstructive pulmonary disease, unspecified: Secondary | ICD-10-CM | POA: Diagnosis not present

## 2022-05-12 DIAGNOSIS — J449 Chronic obstructive pulmonary disease, unspecified: Secondary | ICD-10-CM | POA: Diagnosis not present

## 2022-06-10 DIAGNOSIS — J449 Chronic obstructive pulmonary disease, unspecified: Secondary | ICD-10-CM | POA: Diagnosis not present

## 2022-06-12 DIAGNOSIS — J449 Chronic obstructive pulmonary disease, unspecified: Secondary | ICD-10-CM | POA: Diagnosis not present

## 2022-07-10 DIAGNOSIS — J449 Chronic obstructive pulmonary disease, unspecified: Secondary | ICD-10-CM | POA: Diagnosis not present

## 2022-07-12 DIAGNOSIS — J449 Chronic obstructive pulmonary disease, unspecified: Secondary | ICD-10-CM | POA: Diagnosis not present

## 2022-07-23 DIAGNOSIS — I2510 Atherosclerotic heart disease of native coronary artery without angina pectoris: Secondary | ICD-10-CM | POA: Diagnosis not present

## 2022-07-23 DIAGNOSIS — J439 Emphysema, unspecified: Secondary | ICD-10-CM | POA: Diagnosis not present

## 2022-07-23 DIAGNOSIS — R911 Solitary pulmonary nodule: Secondary | ICD-10-CM | POA: Diagnosis not present

## 2022-07-23 DIAGNOSIS — I700 Atherosclerosis of aorta: Secondary | ICD-10-CM | POA: Diagnosis not present

## 2022-07-29 DIAGNOSIS — J9611 Chronic respiratory failure with hypoxia: Secondary | ICD-10-CM | POA: Diagnosis not present

## 2022-07-29 DIAGNOSIS — Z87891 Personal history of nicotine dependence: Secondary | ICD-10-CM | POA: Diagnosis not present

## 2022-07-29 DIAGNOSIS — I5032 Chronic diastolic (congestive) heart failure: Secondary | ICD-10-CM | POA: Diagnosis not present

## 2022-07-29 DIAGNOSIS — Z9981 Dependence on supplemental oxygen: Secondary | ICD-10-CM | POA: Diagnosis not present

## 2022-07-29 DIAGNOSIS — J449 Chronic obstructive pulmonary disease, unspecified: Secondary | ICD-10-CM | POA: Diagnosis not present

## 2022-08-10 DIAGNOSIS — J449 Chronic obstructive pulmonary disease, unspecified: Secondary | ICD-10-CM | POA: Diagnosis not present

## 2022-08-12 DIAGNOSIS — J449 Chronic obstructive pulmonary disease, unspecified: Secondary | ICD-10-CM | POA: Diagnosis not present

## 2022-08-26 DIAGNOSIS — Z7952 Long term (current) use of systemic steroids: Secondary | ICD-10-CM | POA: Diagnosis not present

## 2022-08-26 DIAGNOSIS — Z7982 Long term (current) use of aspirin: Secondary | ICD-10-CM | POA: Diagnosis not present

## 2022-08-26 DIAGNOSIS — J439 Emphysema, unspecified: Secondary | ICD-10-CM | POA: Diagnosis not present

## 2022-08-26 DIAGNOSIS — Z9981 Dependence on supplemental oxygen: Secondary | ICD-10-CM | POA: Diagnosis not present

## 2022-08-26 DIAGNOSIS — R918 Other nonspecific abnormal finding of lung field: Secondary | ICD-10-CM | POA: Diagnosis not present

## 2022-08-26 DIAGNOSIS — E538 Deficiency of other specified B group vitamins: Secondary | ICD-10-CM | POA: Diagnosis not present

## 2022-08-26 DIAGNOSIS — R0602 Shortness of breath: Secondary | ICD-10-CM | POA: Diagnosis not present

## 2022-08-26 DIAGNOSIS — E785 Hyperlipidemia, unspecified: Secondary | ICD-10-CM | POA: Diagnosis not present

## 2022-08-26 DIAGNOSIS — I491 Atrial premature depolarization: Secondary | ICD-10-CM | POA: Diagnosis not present

## 2022-08-26 DIAGNOSIS — H9190 Unspecified hearing loss, unspecified ear: Secondary | ICD-10-CM | POA: Diagnosis not present

## 2022-08-26 DIAGNOSIS — Z79899 Other long term (current) drug therapy: Secondary | ICD-10-CM | POA: Diagnosis not present

## 2022-08-26 DIAGNOSIS — E8729 Other acidosis: Secondary | ICD-10-CM | POA: Diagnosis not present

## 2022-08-26 DIAGNOSIS — Z20822 Contact with and (suspected) exposure to covid-19: Secondary | ICD-10-CM | POA: Diagnosis not present

## 2022-08-26 DIAGNOSIS — I4510 Unspecified right bundle-branch block: Secondary | ICD-10-CM | POA: Diagnosis not present

## 2022-08-26 DIAGNOSIS — J441 Chronic obstructive pulmonary disease with (acute) exacerbation: Secondary | ICD-10-CM | POA: Diagnosis not present

## 2022-08-26 DIAGNOSIS — J9620 Acute and chronic respiratory failure, unspecified whether with hypoxia or hypercapnia: Secondary | ICD-10-CM | POA: Diagnosis not present

## 2022-08-26 DIAGNOSIS — Z87891 Personal history of nicotine dependence: Secondary | ICD-10-CM | POA: Diagnosis not present

## 2022-08-26 DIAGNOSIS — I10 Essential (primary) hypertension: Secondary | ICD-10-CM | POA: Diagnosis not present

## 2022-08-27 DIAGNOSIS — I4510 Unspecified right bundle-branch block: Secondary | ICD-10-CM | POA: Diagnosis not present

## 2022-08-27 DIAGNOSIS — Z87891 Personal history of nicotine dependence: Secondary | ICD-10-CM | POA: Diagnosis not present

## 2022-08-27 DIAGNOSIS — E538 Deficiency of other specified B group vitamins: Secondary | ICD-10-CM | POA: Diagnosis not present

## 2022-08-27 DIAGNOSIS — E785 Hyperlipidemia, unspecified: Secondary | ICD-10-CM | POA: Diagnosis not present

## 2022-08-27 DIAGNOSIS — I491 Atrial premature depolarization: Secondary | ICD-10-CM | POA: Diagnosis not present

## 2022-08-27 DIAGNOSIS — I10 Essential (primary) hypertension: Secondary | ICD-10-CM | POA: Diagnosis not present

## 2022-08-27 DIAGNOSIS — Z9981 Dependence on supplemental oxygen: Secondary | ICD-10-CM | POA: Diagnosis not present

## 2022-08-27 DIAGNOSIS — J441 Chronic obstructive pulmonary disease with (acute) exacerbation: Secondary | ICD-10-CM | POA: Diagnosis not present

## 2022-08-28 DIAGNOSIS — I10 Essential (primary) hypertension: Secondary | ICD-10-CM | POA: Diagnosis not present

## 2022-08-28 DIAGNOSIS — E785 Hyperlipidemia, unspecified: Secondary | ICD-10-CM | POA: Diagnosis not present

## 2022-08-28 DIAGNOSIS — J441 Chronic obstructive pulmonary disease with (acute) exacerbation: Secondary | ICD-10-CM | POA: Diagnosis not present

## 2022-08-28 DIAGNOSIS — H9190 Unspecified hearing loss, unspecified ear: Secondary | ICD-10-CM | POA: Diagnosis not present

## 2022-08-28 DIAGNOSIS — Z9981 Dependence on supplemental oxygen: Secondary | ICD-10-CM | POA: Diagnosis not present

## 2022-08-29 DIAGNOSIS — Z9981 Dependence on supplemental oxygen: Secondary | ICD-10-CM | POA: Diagnosis not present

## 2022-08-29 DIAGNOSIS — J441 Chronic obstructive pulmonary disease with (acute) exacerbation: Secondary | ICD-10-CM | POA: Diagnosis not present

## 2022-08-30 DIAGNOSIS — J441 Chronic obstructive pulmonary disease with (acute) exacerbation: Secondary | ICD-10-CM | POA: Diagnosis not present

## 2022-08-30 DIAGNOSIS — E785 Hyperlipidemia, unspecified: Secondary | ICD-10-CM | POA: Diagnosis not present

## 2022-08-30 DIAGNOSIS — Z7982 Long term (current) use of aspirin: Secondary | ICD-10-CM | POA: Diagnosis not present

## 2022-08-30 DIAGNOSIS — Z7952 Long term (current) use of systemic steroids: Secondary | ICD-10-CM | POA: Diagnosis not present

## 2022-08-30 DIAGNOSIS — Z9181 History of falling: Secondary | ICD-10-CM | POA: Diagnosis not present

## 2022-08-30 DIAGNOSIS — Z87891 Personal history of nicotine dependence: Secondary | ICD-10-CM | POA: Diagnosis not present

## 2022-08-30 DIAGNOSIS — I10 Essential (primary) hypertension: Secondary | ICD-10-CM | POA: Diagnosis not present

## 2022-08-30 DIAGNOSIS — E538 Deficiency of other specified B group vitamins: Secondary | ICD-10-CM | POA: Diagnosis not present

## 2022-08-30 DIAGNOSIS — Z9981 Dependence on supplemental oxygen: Secondary | ICD-10-CM | POA: Diagnosis not present

## 2022-09-04 DIAGNOSIS — Z9981 Dependence on supplemental oxygen: Secondary | ICD-10-CM | POA: Diagnosis not present

## 2022-09-04 DIAGNOSIS — Z7952 Long term (current) use of systemic steroids: Secondary | ICD-10-CM | POA: Diagnosis not present

## 2022-09-04 DIAGNOSIS — Z7982 Long term (current) use of aspirin: Secondary | ICD-10-CM | POA: Diagnosis not present

## 2022-09-04 DIAGNOSIS — R0602 Shortness of breath: Secondary | ICD-10-CM | POA: Diagnosis not present

## 2022-09-04 DIAGNOSIS — Z87891 Personal history of nicotine dependence: Secondary | ICD-10-CM | POA: Diagnosis not present

## 2022-09-04 DIAGNOSIS — J441 Chronic obstructive pulmonary disease with (acute) exacerbation: Secondary | ICD-10-CM | POA: Diagnosis not present

## 2022-09-04 DIAGNOSIS — J449 Chronic obstructive pulmonary disease, unspecified: Secondary | ICD-10-CM | POA: Diagnosis not present

## 2022-09-04 DIAGNOSIS — E875 Hyperkalemia: Secondary | ICD-10-CM | POA: Diagnosis not present

## 2022-09-04 DIAGNOSIS — E785 Hyperlipidemia, unspecified: Secondary | ICD-10-CM | POA: Diagnosis not present

## 2022-09-04 DIAGNOSIS — E538 Deficiency of other specified B group vitamins: Secondary | ICD-10-CM | POA: Diagnosis not present

## 2022-09-04 DIAGNOSIS — R269 Unspecified abnormalities of gait and mobility: Secondary | ICD-10-CM | POA: Diagnosis not present

## 2022-09-04 DIAGNOSIS — Z09 Encounter for follow-up examination after completed treatment for conditions other than malignant neoplasm: Secondary | ICD-10-CM | POA: Diagnosis not present

## 2022-09-04 DIAGNOSIS — I10 Essential (primary) hypertension: Secondary | ICD-10-CM | POA: Diagnosis not present

## 2022-09-04 DIAGNOSIS — Z9181 History of falling: Secondary | ICD-10-CM | POA: Diagnosis not present

## 2022-09-04 DIAGNOSIS — Z23 Encounter for immunization: Secondary | ICD-10-CM | POA: Diagnosis not present

## 2022-09-05 DIAGNOSIS — Z7952 Long term (current) use of systemic steroids: Secondary | ICD-10-CM | POA: Diagnosis not present

## 2022-09-05 DIAGNOSIS — E785 Hyperlipidemia, unspecified: Secondary | ICD-10-CM | POA: Diagnosis not present

## 2022-09-05 DIAGNOSIS — E538 Deficiency of other specified B group vitamins: Secondary | ICD-10-CM | POA: Diagnosis not present

## 2022-09-05 DIAGNOSIS — Z9181 History of falling: Secondary | ICD-10-CM | POA: Diagnosis not present

## 2022-09-05 DIAGNOSIS — Z7982 Long term (current) use of aspirin: Secondary | ICD-10-CM | POA: Diagnosis not present

## 2022-09-05 DIAGNOSIS — I10 Essential (primary) hypertension: Secondary | ICD-10-CM | POA: Diagnosis not present

## 2022-09-05 DIAGNOSIS — J441 Chronic obstructive pulmonary disease with (acute) exacerbation: Secondary | ICD-10-CM | POA: Diagnosis not present

## 2022-09-05 DIAGNOSIS — Z87891 Personal history of nicotine dependence: Secondary | ICD-10-CM | POA: Diagnosis not present

## 2022-09-05 DIAGNOSIS — Z9981 Dependence on supplemental oxygen: Secondary | ICD-10-CM | POA: Diagnosis not present

## 2022-09-09 DIAGNOSIS — J449 Chronic obstructive pulmonary disease, unspecified: Secondary | ICD-10-CM | POA: Diagnosis not present

## 2022-09-11 DIAGNOSIS — Z7952 Long term (current) use of systemic steroids: Secondary | ICD-10-CM | POA: Diagnosis not present

## 2022-09-11 DIAGNOSIS — J441 Chronic obstructive pulmonary disease with (acute) exacerbation: Secondary | ICD-10-CM | POA: Diagnosis not present

## 2022-09-11 DIAGNOSIS — E785 Hyperlipidemia, unspecified: Secondary | ICD-10-CM | POA: Diagnosis not present

## 2022-09-11 DIAGNOSIS — Z7982 Long term (current) use of aspirin: Secondary | ICD-10-CM | POA: Diagnosis not present

## 2022-09-11 DIAGNOSIS — J449 Chronic obstructive pulmonary disease, unspecified: Secondary | ICD-10-CM | POA: Diagnosis not present

## 2022-09-11 DIAGNOSIS — Z9981 Dependence on supplemental oxygen: Secondary | ICD-10-CM | POA: Diagnosis not present

## 2022-09-11 DIAGNOSIS — I10 Essential (primary) hypertension: Secondary | ICD-10-CM | POA: Diagnosis not present

## 2022-09-11 DIAGNOSIS — Z9181 History of falling: Secondary | ICD-10-CM | POA: Diagnosis not present

## 2022-09-11 DIAGNOSIS — E538 Deficiency of other specified B group vitamins: Secondary | ICD-10-CM | POA: Diagnosis not present

## 2022-09-11 DIAGNOSIS — Z87891 Personal history of nicotine dependence: Secondary | ICD-10-CM | POA: Diagnosis not present

## 2022-09-12 DIAGNOSIS — I10 Essential (primary) hypertension: Secondary | ICD-10-CM | POA: Diagnosis not present

## 2022-09-12 DIAGNOSIS — Z9181 History of falling: Secondary | ICD-10-CM | POA: Diagnosis not present

## 2022-09-12 DIAGNOSIS — E538 Deficiency of other specified B group vitamins: Secondary | ICD-10-CM | POA: Diagnosis not present

## 2022-09-12 DIAGNOSIS — J441 Chronic obstructive pulmonary disease with (acute) exacerbation: Secondary | ICD-10-CM | POA: Diagnosis not present

## 2022-09-12 DIAGNOSIS — E785 Hyperlipidemia, unspecified: Secondary | ICD-10-CM | POA: Diagnosis not present

## 2022-09-12 DIAGNOSIS — Z7982 Long term (current) use of aspirin: Secondary | ICD-10-CM | POA: Diagnosis not present

## 2022-09-12 DIAGNOSIS — Z9981 Dependence on supplemental oxygen: Secondary | ICD-10-CM | POA: Diagnosis not present

## 2022-09-12 DIAGNOSIS — Z7952 Long term (current) use of systemic steroids: Secondary | ICD-10-CM | POA: Diagnosis not present

## 2022-09-12 DIAGNOSIS — Z87891 Personal history of nicotine dependence: Secondary | ICD-10-CM | POA: Diagnosis not present

## 2022-09-13 DIAGNOSIS — Z9981 Dependence on supplemental oxygen: Secondary | ICD-10-CM | POA: Diagnosis not present

## 2022-09-13 DIAGNOSIS — Z7952 Long term (current) use of systemic steroids: Secondary | ICD-10-CM | POA: Diagnosis not present

## 2022-09-13 DIAGNOSIS — Z87891 Personal history of nicotine dependence: Secondary | ICD-10-CM | POA: Diagnosis not present

## 2022-09-13 DIAGNOSIS — Z7982 Long term (current) use of aspirin: Secondary | ICD-10-CM | POA: Diagnosis not present

## 2022-09-13 DIAGNOSIS — I10 Essential (primary) hypertension: Secondary | ICD-10-CM | POA: Diagnosis not present

## 2022-09-13 DIAGNOSIS — Z9181 History of falling: Secondary | ICD-10-CM | POA: Diagnosis not present

## 2022-09-13 DIAGNOSIS — E538 Deficiency of other specified B group vitamins: Secondary | ICD-10-CM | POA: Diagnosis not present

## 2022-09-13 DIAGNOSIS — E785 Hyperlipidemia, unspecified: Secondary | ICD-10-CM | POA: Diagnosis not present

## 2022-09-13 DIAGNOSIS — J441 Chronic obstructive pulmonary disease with (acute) exacerbation: Secondary | ICD-10-CM | POA: Diagnosis not present

## 2022-09-16 DIAGNOSIS — Z9181 History of falling: Secondary | ICD-10-CM | POA: Diagnosis not present

## 2022-09-16 DIAGNOSIS — Z7952 Long term (current) use of systemic steroids: Secondary | ICD-10-CM | POA: Diagnosis not present

## 2022-09-16 DIAGNOSIS — Z9981 Dependence on supplemental oxygen: Secondary | ICD-10-CM | POA: Diagnosis not present

## 2022-09-16 DIAGNOSIS — Z87891 Personal history of nicotine dependence: Secondary | ICD-10-CM | POA: Diagnosis not present

## 2022-09-16 DIAGNOSIS — E785 Hyperlipidemia, unspecified: Secondary | ICD-10-CM | POA: Diagnosis not present

## 2022-09-16 DIAGNOSIS — I10 Essential (primary) hypertension: Secondary | ICD-10-CM | POA: Diagnosis not present

## 2022-09-16 DIAGNOSIS — Z7982 Long term (current) use of aspirin: Secondary | ICD-10-CM | POA: Diagnosis not present

## 2022-09-16 DIAGNOSIS — J441 Chronic obstructive pulmonary disease with (acute) exacerbation: Secondary | ICD-10-CM | POA: Diagnosis not present

## 2022-09-16 DIAGNOSIS — E538 Deficiency of other specified B group vitamins: Secondary | ICD-10-CM | POA: Diagnosis not present

## 2022-09-17 DIAGNOSIS — Z9181 History of falling: Secondary | ICD-10-CM | POA: Diagnosis not present

## 2022-09-17 DIAGNOSIS — Z7952 Long term (current) use of systemic steroids: Secondary | ICD-10-CM | POA: Diagnosis not present

## 2022-09-17 DIAGNOSIS — Z9981 Dependence on supplemental oxygen: Secondary | ICD-10-CM | POA: Diagnosis not present

## 2022-09-17 DIAGNOSIS — I10 Essential (primary) hypertension: Secondary | ICD-10-CM | POA: Diagnosis not present

## 2022-09-17 DIAGNOSIS — E785 Hyperlipidemia, unspecified: Secondary | ICD-10-CM | POA: Diagnosis not present

## 2022-09-17 DIAGNOSIS — Z7982 Long term (current) use of aspirin: Secondary | ICD-10-CM | POA: Diagnosis not present

## 2022-09-17 DIAGNOSIS — Z87891 Personal history of nicotine dependence: Secondary | ICD-10-CM | POA: Diagnosis not present

## 2022-09-17 DIAGNOSIS — E538 Deficiency of other specified B group vitamins: Secondary | ICD-10-CM | POA: Diagnosis not present

## 2022-09-17 DIAGNOSIS — J441 Chronic obstructive pulmonary disease with (acute) exacerbation: Secondary | ICD-10-CM | POA: Diagnosis not present

## 2022-09-19 DIAGNOSIS — E785 Hyperlipidemia, unspecified: Secondary | ICD-10-CM | POA: Diagnosis not present

## 2022-09-19 DIAGNOSIS — J441 Chronic obstructive pulmonary disease with (acute) exacerbation: Secondary | ICD-10-CM | POA: Diagnosis not present

## 2022-09-19 DIAGNOSIS — Z9981 Dependence on supplemental oxygen: Secondary | ICD-10-CM | POA: Diagnosis not present

## 2022-09-19 DIAGNOSIS — E538 Deficiency of other specified B group vitamins: Secondary | ICD-10-CM | POA: Diagnosis not present

## 2022-09-19 DIAGNOSIS — Z87891 Personal history of nicotine dependence: Secondary | ICD-10-CM | POA: Diagnosis not present

## 2022-09-19 DIAGNOSIS — Z7952 Long term (current) use of systemic steroids: Secondary | ICD-10-CM | POA: Diagnosis not present

## 2022-09-19 DIAGNOSIS — I10 Essential (primary) hypertension: Secondary | ICD-10-CM | POA: Diagnosis not present

## 2022-09-19 DIAGNOSIS — Z9181 History of falling: Secondary | ICD-10-CM | POA: Diagnosis not present

## 2022-09-19 DIAGNOSIS — Z7982 Long term (current) use of aspirin: Secondary | ICD-10-CM | POA: Diagnosis not present

## 2022-09-24 DIAGNOSIS — Z87891 Personal history of nicotine dependence: Secondary | ICD-10-CM | POA: Diagnosis not present

## 2022-09-24 DIAGNOSIS — Z7982 Long term (current) use of aspirin: Secondary | ICD-10-CM | POA: Diagnosis not present

## 2022-09-24 DIAGNOSIS — Z9181 History of falling: Secondary | ICD-10-CM | POA: Diagnosis not present

## 2022-09-24 DIAGNOSIS — I10 Essential (primary) hypertension: Secondary | ICD-10-CM | POA: Diagnosis not present

## 2022-09-24 DIAGNOSIS — Z7952 Long term (current) use of systemic steroids: Secondary | ICD-10-CM | POA: Diagnosis not present

## 2022-09-24 DIAGNOSIS — E785 Hyperlipidemia, unspecified: Secondary | ICD-10-CM | POA: Diagnosis not present

## 2022-09-24 DIAGNOSIS — J441 Chronic obstructive pulmonary disease with (acute) exacerbation: Secondary | ICD-10-CM | POA: Diagnosis not present

## 2022-09-24 DIAGNOSIS — E538 Deficiency of other specified B group vitamins: Secondary | ICD-10-CM | POA: Diagnosis not present

## 2022-09-24 DIAGNOSIS — Z9981 Dependence on supplemental oxygen: Secondary | ICD-10-CM | POA: Diagnosis not present

## 2022-09-26 DIAGNOSIS — Z9981 Dependence on supplemental oxygen: Secondary | ICD-10-CM | POA: Diagnosis not present

## 2022-09-26 DIAGNOSIS — J441 Chronic obstructive pulmonary disease with (acute) exacerbation: Secondary | ICD-10-CM | POA: Diagnosis not present

## 2022-09-26 DIAGNOSIS — E785 Hyperlipidemia, unspecified: Secondary | ICD-10-CM | POA: Diagnosis not present

## 2022-09-26 DIAGNOSIS — Z7952 Long term (current) use of systemic steroids: Secondary | ICD-10-CM | POA: Diagnosis not present

## 2022-09-26 DIAGNOSIS — Z7982 Long term (current) use of aspirin: Secondary | ICD-10-CM | POA: Diagnosis not present

## 2022-09-26 DIAGNOSIS — Z87891 Personal history of nicotine dependence: Secondary | ICD-10-CM | POA: Diagnosis not present

## 2022-09-26 DIAGNOSIS — E538 Deficiency of other specified B group vitamins: Secondary | ICD-10-CM | POA: Diagnosis not present

## 2022-09-26 DIAGNOSIS — I10 Essential (primary) hypertension: Secondary | ICD-10-CM | POA: Diagnosis not present

## 2022-09-26 DIAGNOSIS — Z9181 History of falling: Secondary | ICD-10-CM | POA: Diagnosis not present

## 2022-09-27 DIAGNOSIS — Z7952 Long term (current) use of systemic steroids: Secondary | ICD-10-CM | POA: Diagnosis not present

## 2022-09-27 DIAGNOSIS — Z9181 History of falling: Secondary | ICD-10-CM | POA: Diagnosis not present

## 2022-09-27 DIAGNOSIS — Z9981 Dependence on supplemental oxygen: Secondary | ICD-10-CM | POA: Diagnosis not present

## 2022-09-27 DIAGNOSIS — E538 Deficiency of other specified B group vitamins: Secondary | ICD-10-CM | POA: Diagnosis not present

## 2022-09-27 DIAGNOSIS — E785 Hyperlipidemia, unspecified: Secondary | ICD-10-CM | POA: Diagnosis not present

## 2022-09-27 DIAGNOSIS — I10 Essential (primary) hypertension: Secondary | ICD-10-CM | POA: Diagnosis not present

## 2022-09-27 DIAGNOSIS — J441 Chronic obstructive pulmonary disease with (acute) exacerbation: Secondary | ICD-10-CM | POA: Diagnosis not present

## 2022-09-27 DIAGNOSIS — Z7982 Long term (current) use of aspirin: Secondary | ICD-10-CM | POA: Diagnosis not present

## 2022-09-27 DIAGNOSIS — Z87891 Personal history of nicotine dependence: Secondary | ICD-10-CM | POA: Diagnosis not present

## 2022-10-05 DIAGNOSIS — I5032 Chronic diastolic (congestive) heart failure: Secondary | ICD-10-CM | POA: Diagnosis not present

## 2022-10-05 DIAGNOSIS — B9789 Other viral agents as the cause of diseases classified elsewhere: Secondary | ICD-10-CM | POA: Diagnosis not present

## 2022-10-05 DIAGNOSIS — B348 Other viral infections of unspecified site: Secondary | ICD-10-CM | POA: Diagnosis not present

## 2022-10-05 DIAGNOSIS — I272 Pulmonary hypertension, unspecified: Secondary | ICD-10-CM | POA: Diagnosis not present

## 2022-10-05 DIAGNOSIS — Z7409 Other reduced mobility: Secondary | ICD-10-CM | POA: Diagnosis not present

## 2022-10-05 DIAGNOSIS — E8729 Other acidosis: Secondary | ICD-10-CM | POA: Diagnosis not present

## 2022-10-05 DIAGNOSIS — Z720 Tobacco use: Secondary | ICD-10-CM | POA: Diagnosis not present

## 2022-10-05 DIAGNOSIS — I491 Atrial premature depolarization: Secondary | ICD-10-CM | POA: Diagnosis not present

## 2022-10-05 DIAGNOSIS — J9622 Acute and chronic respiratory failure with hypercapnia: Secondary | ICD-10-CM | POA: Diagnosis not present

## 2022-10-05 DIAGNOSIS — Z79899 Other long term (current) drug therapy: Secondary | ICD-10-CM | POA: Diagnosis not present

## 2022-10-05 DIAGNOSIS — I5030 Unspecified diastolic (congestive) heart failure: Secondary | ICD-10-CM | POA: Diagnosis not present

## 2022-10-05 DIAGNOSIS — R6889 Other general symptoms and signs: Secondary | ICD-10-CM | POA: Diagnosis not present

## 2022-10-05 DIAGNOSIS — I700 Atherosclerosis of aorta: Secondary | ICD-10-CM | POA: Diagnosis not present

## 2022-10-05 DIAGNOSIS — R0902 Hypoxemia: Secondary | ICD-10-CM | POA: Diagnosis not present

## 2022-10-05 DIAGNOSIS — J9612 Chronic respiratory failure with hypercapnia: Secondary | ICD-10-CM | POA: Diagnosis not present

## 2022-10-05 DIAGNOSIS — Z743 Need for continuous supervision: Secondary | ICD-10-CM | POA: Diagnosis not present

## 2022-10-05 DIAGNOSIS — R918 Other nonspecific abnormal finding of lung field: Secondary | ICD-10-CM | POA: Diagnosis not present

## 2022-10-05 DIAGNOSIS — J9621 Acute and chronic respiratory failure with hypoxia: Secondary | ICD-10-CM | POA: Diagnosis not present

## 2022-10-05 DIAGNOSIS — Z7982 Long term (current) use of aspirin: Secondary | ICD-10-CM | POA: Diagnosis not present

## 2022-10-05 DIAGNOSIS — I4510 Unspecified right bundle-branch block: Secondary | ICD-10-CM | POA: Diagnosis not present

## 2022-10-05 DIAGNOSIS — J9691 Respiratory failure, unspecified with hypoxia: Secondary | ICD-10-CM | POA: Diagnosis not present

## 2022-10-05 DIAGNOSIS — J441 Chronic obstructive pulmonary disease with (acute) exacerbation: Secondary | ICD-10-CM | POA: Diagnosis not present

## 2022-10-05 DIAGNOSIS — Z9981 Dependence on supplemental oxygen: Secondary | ICD-10-CM | POA: Diagnosis not present

## 2022-10-05 DIAGNOSIS — R0602 Shortness of breath: Secondary | ICD-10-CM | POA: Diagnosis not present

## 2022-10-05 DIAGNOSIS — R911 Solitary pulmonary nodule: Secondary | ICD-10-CM | POA: Diagnosis not present

## 2022-10-05 DIAGNOSIS — I444 Left anterior fascicular block: Secondary | ICD-10-CM | POA: Diagnosis not present

## 2022-10-05 DIAGNOSIS — J449 Chronic obstructive pulmonary disease, unspecified: Secondary | ICD-10-CM | POA: Diagnosis not present

## 2022-10-05 DIAGNOSIS — Z87891 Personal history of nicotine dependence: Secondary | ICD-10-CM | POA: Diagnosis not present

## 2022-10-05 DIAGNOSIS — I110 Hypertensive heart disease with heart failure: Secondary | ICD-10-CM | POA: Diagnosis not present

## 2022-10-05 DIAGNOSIS — Z8616 Personal history of COVID-19: Secondary | ICD-10-CM | POA: Diagnosis not present

## 2022-10-05 DIAGNOSIS — Z20822 Contact with and (suspected) exposure to covid-19: Secondary | ICD-10-CM | POA: Diagnosis not present

## 2022-10-05 DIAGNOSIS — J439 Emphysema, unspecified: Secondary | ICD-10-CM | POA: Diagnosis not present

## 2022-10-05 DIAGNOSIS — I440 Atrioventricular block, first degree: Secondary | ICD-10-CM | POA: Diagnosis not present

## 2022-10-12 DIAGNOSIS — J449 Chronic obstructive pulmonary disease, unspecified: Secondary | ICD-10-CM | POA: Diagnosis not present

## 2022-10-14 DIAGNOSIS — Z87891 Personal history of nicotine dependence: Secondary | ICD-10-CM | POA: Diagnosis not present

## 2022-10-14 DIAGNOSIS — Z9981 Dependence on supplemental oxygen: Secondary | ICD-10-CM | POA: Diagnosis not present

## 2022-10-14 DIAGNOSIS — Z7952 Long term (current) use of systemic steroids: Secondary | ICD-10-CM | POA: Diagnosis not present

## 2022-10-14 DIAGNOSIS — J441 Chronic obstructive pulmonary disease with (acute) exacerbation: Secondary | ICD-10-CM | POA: Diagnosis not present

## 2022-10-14 DIAGNOSIS — I10 Essential (primary) hypertension: Secondary | ICD-10-CM | POA: Diagnosis not present

## 2022-10-14 DIAGNOSIS — E785 Hyperlipidemia, unspecified: Secondary | ICD-10-CM | POA: Diagnosis not present

## 2022-10-14 DIAGNOSIS — Z7982 Long term (current) use of aspirin: Secondary | ICD-10-CM | POA: Diagnosis not present

## 2022-10-14 DIAGNOSIS — E538 Deficiency of other specified B group vitamins: Secondary | ICD-10-CM | POA: Diagnosis not present

## 2022-10-14 DIAGNOSIS — Z9181 History of falling: Secondary | ICD-10-CM | POA: Diagnosis not present

## 2022-10-15 DIAGNOSIS — E538 Deficiency of other specified B group vitamins: Secondary | ICD-10-CM | POA: Diagnosis not present

## 2022-10-15 DIAGNOSIS — Z9181 History of falling: Secondary | ICD-10-CM | POA: Diagnosis not present

## 2022-10-15 DIAGNOSIS — E785 Hyperlipidemia, unspecified: Secondary | ICD-10-CM | POA: Diagnosis not present

## 2022-10-15 DIAGNOSIS — Z7952 Long term (current) use of systemic steroids: Secondary | ICD-10-CM | POA: Diagnosis not present

## 2022-10-15 DIAGNOSIS — J441 Chronic obstructive pulmonary disease with (acute) exacerbation: Secondary | ICD-10-CM | POA: Diagnosis not present

## 2022-10-15 DIAGNOSIS — Z7982 Long term (current) use of aspirin: Secondary | ICD-10-CM | POA: Diagnosis not present

## 2022-10-15 DIAGNOSIS — Z87891 Personal history of nicotine dependence: Secondary | ICD-10-CM | POA: Diagnosis not present

## 2022-10-15 DIAGNOSIS — I10 Essential (primary) hypertension: Secondary | ICD-10-CM | POA: Diagnosis not present

## 2022-10-15 DIAGNOSIS — Z9981 Dependence on supplemental oxygen: Secondary | ICD-10-CM | POA: Diagnosis not present

## 2022-10-17 DIAGNOSIS — J441 Chronic obstructive pulmonary disease with (acute) exacerbation: Secondary | ICD-10-CM | POA: Diagnosis not present

## 2022-10-17 DIAGNOSIS — Z9981 Dependence on supplemental oxygen: Secondary | ICD-10-CM | POA: Diagnosis not present

## 2022-10-17 DIAGNOSIS — Z87891 Personal history of nicotine dependence: Secondary | ICD-10-CM | POA: Diagnosis not present

## 2022-10-17 DIAGNOSIS — E785 Hyperlipidemia, unspecified: Secondary | ICD-10-CM | POA: Diagnosis not present

## 2022-10-17 DIAGNOSIS — E538 Deficiency of other specified B group vitamins: Secondary | ICD-10-CM | POA: Diagnosis not present

## 2022-10-17 DIAGNOSIS — I10 Essential (primary) hypertension: Secondary | ICD-10-CM | POA: Diagnosis not present

## 2022-10-17 DIAGNOSIS — Z7982 Long term (current) use of aspirin: Secondary | ICD-10-CM | POA: Diagnosis not present

## 2022-10-17 DIAGNOSIS — Z9181 History of falling: Secondary | ICD-10-CM | POA: Diagnosis not present

## 2022-10-17 DIAGNOSIS — Z7952 Long term (current) use of systemic steroids: Secondary | ICD-10-CM | POA: Diagnosis not present

## 2022-10-21 DIAGNOSIS — I10 Essential (primary) hypertension: Secondary | ICD-10-CM | POA: Diagnosis not present

## 2022-10-21 DIAGNOSIS — J441 Chronic obstructive pulmonary disease with (acute) exacerbation: Secondary | ICD-10-CM | POA: Diagnosis not present

## 2022-10-21 DIAGNOSIS — Z87891 Personal history of nicotine dependence: Secondary | ICD-10-CM | POA: Diagnosis not present

## 2022-10-21 DIAGNOSIS — E538 Deficiency of other specified B group vitamins: Secondary | ICD-10-CM | POA: Diagnosis not present

## 2022-10-21 DIAGNOSIS — Z7952 Long term (current) use of systemic steroids: Secondary | ICD-10-CM | POA: Diagnosis not present

## 2022-10-21 DIAGNOSIS — Z9181 History of falling: Secondary | ICD-10-CM | POA: Diagnosis not present

## 2022-10-21 DIAGNOSIS — Z9981 Dependence on supplemental oxygen: Secondary | ICD-10-CM | POA: Diagnosis not present

## 2022-10-21 DIAGNOSIS — Z7982 Long term (current) use of aspirin: Secondary | ICD-10-CM | POA: Diagnosis not present

## 2022-10-21 DIAGNOSIS — E785 Hyperlipidemia, unspecified: Secondary | ICD-10-CM | POA: Diagnosis not present

## 2022-10-24 DIAGNOSIS — J441 Chronic obstructive pulmonary disease with (acute) exacerbation: Secondary | ICD-10-CM | POA: Diagnosis not present

## 2022-10-24 DIAGNOSIS — E785 Hyperlipidemia, unspecified: Secondary | ICD-10-CM | POA: Diagnosis not present

## 2022-10-24 DIAGNOSIS — Z87891 Personal history of nicotine dependence: Secondary | ICD-10-CM | POA: Diagnosis not present

## 2022-10-24 DIAGNOSIS — Z7982 Long term (current) use of aspirin: Secondary | ICD-10-CM | POA: Diagnosis not present

## 2022-10-24 DIAGNOSIS — Z9181 History of falling: Secondary | ICD-10-CM | POA: Diagnosis not present

## 2022-10-24 DIAGNOSIS — I10 Essential (primary) hypertension: Secondary | ICD-10-CM | POA: Diagnosis not present

## 2022-10-24 DIAGNOSIS — E538 Deficiency of other specified B group vitamins: Secondary | ICD-10-CM | POA: Diagnosis not present

## 2022-10-24 DIAGNOSIS — Z7952 Long term (current) use of systemic steroids: Secondary | ICD-10-CM | POA: Diagnosis not present

## 2022-10-24 DIAGNOSIS — Z9981 Dependence on supplemental oxygen: Secondary | ICD-10-CM | POA: Diagnosis not present

## 2022-10-25 DIAGNOSIS — N3000 Acute cystitis without hematuria: Secondary | ICD-10-CM | POA: Diagnosis not present

## 2022-10-25 DIAGNOSIS — Z79899 Other long term (current) drug therapy: Secondary | ICD-10-CM | POA: Diagnosis not present

## 2022-10-25 DIAGNOSIS — J439 Emphysema, unspecified: Secondary | ICD-10-CM | POA: Diagnosis not present

## 2022-10-25 DIAGNOSIS — R918 Other nonspecific abnormal finding of lung field: Secondary | ICD-10-CM | POA: Diagnosis not present

## 2022-10-25 DIAGNOSIS — I444 Left anterior fascicular block: Secondary | ICD-10-CM | POA: Diagnosis not present

## 2022-10-25 DIAGNOSIS — Z7952 Long term (current) use of systemic steroids: Secondary | ICD-10-CM | POA: Diagnosis not present

## 2022-10-25 DIAGNOSIS — Z9981 Dependence on supplemental oxygen: Secondary | ICD-10-CM | POA: Diagnosis not present

## 2022-10-25 DIAGNOSIS — Z4682 Encounter for fitting and adjustment of non-vascular catheter: Secondary | ICD-10-CM | POA: Diagnosis not present

## 2022-10-25 DIAGNOSIS — R571 Hypovolemic shock: Secondary | ICD-10-CM | POA: Diagnosis not present

## 2022-10-25 DIAGNOSIS — K2210 Ulcer of esophagus without bleeding: Secondary | ICD-10-CM | POA: Diagnosis not present

## 2022-10-25 DIAGNOSIS — J449 Chronic obstructive pulmonary disease, unspecified: Secondary | ICD-10-CM | POA: Diagnosis not present

## 2022-10-25 DIAGNOSIS — G9340 Encephalopathy, unspecified: Secondary | ICD-10-CM | POA: Diagnosis not present

## 2022-10-25 DIAGNOSIS — K254 Chronic or unspecified gastric ulcer with hemorrhage: Secondary | ICD-10-CM | POA: Diagnosis not present

## 2022-10-25 DIAGNOSIS — E875 Hyperkalemia: Secondary | ICD-10-CM | POA: Diagnosis not present

## 2022-10-25 DIAGNOSIS — I10 Essential (primary) hypertension: Secondary | ICD-10-CM | POA: Diagnosis not present

## 2022-10-25 DIAGNOSIS — B9689 Other specified bacterial agents as the cause of diseases classified elsewhere: Secondary | ICD-10-CM | POA: Diagnosis not present

## 2022-10-25 DIAGNOSIS — D509 Iron deficiency anemia, unspecified: Secondary | ICD-10-CM | POA: Diagnosis not present

## 2022-10-25 DIAGNOSIS — Z87891 Personal history of nicotine dependence: Secondary | ICD-10-CM | POA: Diagnosis not present

## 2022-10-25 DIAGNOSIS — R911 Solitary pulmonary nodule: Secondary | ICD-10-CM | POA: Diagnosis not present

## 2022-10-25 DIAGNOSIS — B9789 Other viral agents as the cause of diseases classified elsewhere: Secondary | ICD-10-CM | POA: Diagnosis not present

## 2022-10-25 DIAGNOSIS — H1031 Unspecified acute conjunctivitis, right eye: Secondary | ICD-10-CM | POA: Diagnosis not present

## 2022-10-25 DIAGNOSIS — E538 Deficiency of other specified B group vitamins: Secondary | ICD-10-CM | POA: Diagnosis not present

## 2022-10-25 DIAGNOSIS — R0902 Hypoxemia: Secondary | ICD-10-CM | POA: Diagnosis not present

## 2022-10-25 DIAGNOSIS — E785 Hyperlipidemia, unspecified: Secondary | ICD-10-CM | POA: Diagnosis not present

## 2022-10-25 DIAGNOSIS — K922 Gastrointestinal hemorrhage, unspecified: Secondary | ICD-10-CM | POA: Diagnosis not present

## 2022-10-25 DIAGNOSIS — M7989 Other specified soft tissue disorders: Secondary | ICD-10-CM | POA: Diagnosis not present

## 2022-10-25 DIAGNOSIS — Z743 Need for continuous supervision: Secondary | ICD-10-CM | POA: Diagnosis not present

## 2022-10-25 DIAGNOSIS — G9341 Metabolic encephalopathy: Secondary | ICD-10-CM | POA: Diagnosis not present

## 2022-10-25 DIAGNOSIS — R5383 Other fatigue: Secondary | ICD-10-CM | POA: Diagnosis not present

## 2022-10-25 DIAGNOSIS — Z781 Physical restraint status: Secondary | ICD-10-CM | POA: Diagnosis not present

## 2022-10-25 DIAGNOSIS — K209 Esophagitis, unspecified without bleeding: Secondary | ICD-10-CM | POA: Diagnosis not present

## 2022-10-25 DIAGNOSIS — R464 Slowness and poor responsiveness: Secondary | ICD-10-CM | POA: Diagnosis not present

## 2022-10-25 DIAGNOSIS — I110 Hypertensive heart disease with heart failure: Secondary | ICD-10-CM | POA: Diagnosis not present

## 2022-10-25 DIAGNOSIS — D649 Anemia, unspecified: Secondary | ICD-10-CM | POA: Diagnosis not present

## 2022-10-25 DIAGNOSIS — E861 Hypovolemia: Secondary | ICD-10-CM | POA: Diagnosis not present

## 2022-10-25 DIAGNOSIS — R6339 Other feeding difficulties: Secondary | ICD-10-CM | POA: Diagnosis not present

## 2022-10-25 DIAGNOSIS — I452 Bifascicular block: Secondary | ICD-10-CM | POA: Diagnosis not present

## 2022-10-25 DIAGNOSIS — I5033 Acute on chronic diastolic (congestive) heart failure: Secondary | ICD-10-CM | POA: Diagnosis not present

## 2022-10-25 DIAGNOSIS — B348 Other viral infections of unspecified site: Secondary | ICD-10-CM | POA: Diagnosis not present

## 2022-10-25 DIAGNOSIS — K2091 Esophagitis, unspecified with bleeding: Secondary | ICD-10-CM | POA: Diagnosis not present

## 2022-10-25 DIAGNOSIS — D62 Acute posthemorrhagic anemia: Secondary | ICD-10-CM | POA: Diagnosis not present

## 2022-10-25 DIAGNOSIS — K259 Gastric ulcer, unspecified as acute or chronic, without hemorrhage or perforation: Secondary | ICD-10-CM | POA: Diagnosis not present

## 2022-10-25 DIAGNOSIS — R578 Other shock: Secondary | ICD-10-CM | POA: Diagnosis not present

## 2022-10-25 DIAGNOSIS — I4510 Unspecified right bundle-branch block: Secondary | ICD-10-CM | POA: Diagnosis not present

## 2022-10-25 DIAGNOSIS — N179 Acute kidney failure, unspecified: Secondary | ICD-10-CM | POA: Diagnosis not present

## 2022-10-25 DIAGNOSIS — I959 Hypotension, unspecified: Secondary | ICD-10-CM | POA: Diagnosis not present

## 2022-10-25 DIAGNOSIS — K449 Diaphragmatic hernia without obstruction or gangrene: Secondary | ICD-10-CM | POA: Diagnosis not present

## 2022-10-25 DIAGNOSIS — I5032 Chronic diastolic (congestive) heart failure: Secondary | ICD-10-CM | POA: Diagnosis not present

## 2022-10-25 DIAGNOSIS — R1310 Dysphagia, unspecified: Secondary | ICD-10-CM | POA: Diagnosis not present

## 2022-10-25 DIAGNOSIS — K2950 Unspecified chronic gastritis without bleeding: Secondary | ICD-10-CM | POA: Diagnosis not present

## 2022-10-25 DIAGNOSIS — I491 Atrial premature depolarization: Secondary | ICD-10-CM | POA: Diagnosis not present

## 2022-10-25 DIAGNOSIS — J9621 Acute and chronic respiratory failure with hypoxia: Secondary | ICD-10-CM | POA: Diagnosis not present

## 2022-10-25 DIAGNOSIS — I4891 Unspecified atrial fibrillation: Secondary | ICD-10-CM | POA: Diagnosis not present

## 2022-10-25 DIAGNOSIS — I509 Heart failure, unspecified: Secondary | ICD-10-CM | POA: Diagnosis not present

## 2022-10-25 DIAGNOSIS — K920 Hematemesis: Secondary | ICD-10-CM | POA: Diagnosis not present

## 2022-10-25 DIAGNOSIS — R531 Weakness: Secondary | ICD-10-CM | POA: Diagnosis not present

## 2022-10-25 DIAGNOSIS — G928 Other toxic encephalopathy: Secondary | ICD-10-CM | POA: Diagnosis not present

## 2022-10-25 DIAGNOSIS — J9622 Acute and chronic respiratory failure with hypercapnia: Secondary | ICD-10-CM | POA: Diagnosis not present

## 2022-10-25 DIAGNOSIS — Z9911 Dependence on respirator [ventilator] status: Secondary | ICD-10-CM | POA: Diagnosis not present

## 2022-10-25 DIAGNOSIS — I371 Nonrheumatic pulmonary valve insufficiency: Secondary | ICD-10-CM | POA: Diagnosis not present

## 2022-10-25 DIAGNOSIS — Z7982 Long term (current) use of aspirin: Secondary | ICD-10-CM | POA: Diagnosis not present

## 2022-10-25 DIAGNOSIS — J441 Chronic obstructive pulmonary disease with (acute) exacerbation: Secondary | ICD-10-CM | POA: Diagnosis not present

## 2022-10-25 DIAGNOSIS — Z9181 History of falling: Secondary | ICD-10-CM | POA: Diagnosis not present

## 2022-10-26 DIAGNOSIS — R911 Solitary pulmonary nodule: Secondary | ICD-10-CM | POA: Diagnosis not present

## 2022-10-26 DIAGNOSIS — N3000 Acute cystitis without hematuria: Secondary | ICD-10-CM | POA: Diagnosis not present

## 2022-10-26 DIAGNOSIS — I110 Hypertensive heart disease with heart failure: Secondary | ICD-10-CM | POA: Diagnosis not present

## 2022-10-26 DIAGNOSIS — J441 Chronic obstructive pulmonary disease with (acute) exacerbation: Secondary | ICD-10-CM | POA: Diagnosis not present

## 2022-10-26 DIAGNOSIS — D649 Anemia, unspecified: Secondary | ICD-10-CM | POA: Diagnosis not present

## 2022-10-26 DIAGNOSIS — I5033 Acute on chronic diastolic (congestive) heart failure: Secondary | ICD-10-CM | POA: Diagnosis not present

## 2022-10-26 DIAGNOSIS — G9341 Metabolic encephalopathy: Secondary | ICD-10-CM | POA: Diagnosis not present

## 2022-10-26 DIAGNOSIS — J9622 Acute and chronic respiratory failure with hypercapnia: Secondary | ICD-10-CM | POA: Diagnosis not present

## 2022-10-27 DIAGNOSIS — D649 Anemia, unspecified: Secondary | ICD-10-CM | POA: Diagnosis not present

## 2022-10-27 DIAGNOSIS — J441 Chronic obstructive pulmonary disease with (acute) exacerbation: Secondary | ICD-10-CM | POA: Diagnosis not present

## 2022-10-27 DIAGNOSIS — J9622 Acute and chronic respiratory failure with hypercapnia: Secondary | ICD-10-CM | POA: Diagnosis not present

## 2022-10-27 DIAGNOSIS — I5033 Acute on chronic diastolic (congestive) heart failure: Secondary | ICD-10-CM | POA: Diagnosis not present

## 2022-10-27 DIAGNOSIS — G9341 Metabolic encephalopathy: Secondary | ICD-10-CM | POA: Diagnosis not present

## 2022-10-27 DIAGNOSIS — I110 Hypertensive heart disease with heart failure: Secondary | ICD-10-CM | POA: Diagnosis not present

## 2022-10-27 DIAGNOSIS — R911 Solitary pulmonary nodule: Secondary | ICD-10-CM | POA: Diagnosis not present

## 2022-10-27 DIAGNOSIS — H1031 Unspecified acute conjunctivitis, right eye: Secondary | ICD-10-CM | POA: Diagnosis not present

## 2022-10-27 DIAGNOSIS — Z9981 Dependence on supplemental oxygen: Secondary | ICD-10-CM | POA: Diagnosis not present

## 2022-10-27 DIAGNOSIS — B9689 Other specified bacterial agents as the cause of diseases classified elsewhere: Secondary | ICD-10-CM | POA: Diagnosis not present

## 2022-10-28 DIAGNOSIS — J441 Chronic obstructive pulmonary disease with (acute) exacerbation: Secondary | ICD-10-CM | POA: Diagnosis not present

## 2022-10-28 DIAGNOSIS — D509 Iron deficiency anemia, unspecified: Secondary | ICD-10-CM | POA: Diagnosis not present

## 2022-10-28 DIAGNOSIS — R0902 Hypoxemia: Secondary | ICD-10-CM | POA: Diagnosis not present

## 2022-10-28 DIAGNOSIS — H1031 Unspecified acute conjunctivitis, right eye: Secondary | ICD-10-CM | POA: Diagnosis not present

## 2022-10-28 DIAGNOSIS — R918 Other nonspecific abnormal finding of lung field: Secondary | ICD-10-CM | POA: Diagnosis not present

## 2022-10-28 DIAGNOSIS — I110 Hypertensive heart disease with heart failure: Secondary | ICD-10-CM | POA: Diagnosis not present

## 2022-10-28 DIAGNOSIS — R911 Solitary pulmonary nodule: Secondary | ICD-10-CM | POA: Diagnosis not present

## 2022-10-28 DIAGNOSIS — Z9981 Dependence on supplemental oxygen: Secondary | ICD-10-CM | POA: Diagnosis not present

## 2022-10-28 DIAGNOSIS — B9689 Other specified bacterial agents as the cause of diseases classified elsewhere: Secondary | ICD-10-CM | POA: Diagnosis not present

## 2022-10-28 DIAGNOSIS — I5033 Acute on chronic diastolic (congestive) heart failure: Secondary | ICD-10-CM | POA: Diagnosis not present

## 2022-10-28 DIAGNOSIS — J9621 Acute and chronic respiratory failure with hypoxia: Secondary | ICD-10-CM | POA: Diagnosis not present

## 2022-10-28 DIAGNOSIS — J9622 Acute and chronic respiratory failure with hypercapnia: Secondary | ICD-10-CM | POA: Diagnosis not present

## 2022-10-28 DIAGNOSIS — G9340 Encephalopathy, unspecified: Secondary | ICD-10-CM | POA: Diagnosis not present

## 2022-10-29 DIAGNOSIS — K2210 Ulcer of esophagus without bleeding: Secondary | ICD-10-CM | POA: Diagnosis not present

## 2022-10-29 DIAGNOSIS — G9341 Metabolic encephalopathy: Secondary | ICD-10-CM | POA: Diagnosis not present

## 2022-10-29 DIAGNOSIS — K922 Gastrointestinal hemorrhage, unspecified: Secondary | ICD-10-CM | POA: Diagnosis not present

## 2022-10-29 DIAGNOSIS — K449 Diaphragmatic hernia without obstruction or gangrene: Secondary | ICD-10-CM | POA: Diagnosis not present

## 2022-10-29 DIAGNOSIS — I5033 Acute on chronic diastolic (congestive) heart failure: Secondary | ICD-10-CM | POA: Diagnosis not present

## 2022-10-29 DIAGNOSIS — I110 Hypertensive heart disease with heart failure: Secondary | ICD-10-CM | POA: Diagnosis not present

## 2022-10-29 DIAGNOSIS — J9621 Acute and chronic respiratory failure with hypoxia: Secondary | ICD-10-CM | POA: Diagnosis not present

## 2022-10-29 DIAGNOSIS — G928 Other toxic encephalopathy: Secondary | ICD-10-CM | POA: Diagnosis not present

## 2022-10-29 DIAGNOSIS — J9622 Acute and chronic respiratory failure with hypercapnia: Secondary | ICD-10-CM | POA: Diagnosis not present

## 2022-10-29 DIAGNOSIS — R911 Solitary pulmonary nodule: Secondary | ICD-10-CM | POA: Diagnosis not present

## 2022-10-29 DIAGNOSIS — I4891 Unspecified atrial fibrillation: Secondary | ICD-10-CM | POA: Diagnosis not present

## 2022-10-29 DIAGNOSIS — G9340 Encephalopathy, unspecified: Secondary | ICD-10-CM | POA: Diagnosis not present

## 2022-10-29 DIAGNOSIS — D62 Acute posthemorrhagic anemia: Secondary | ICD-10-CM | POA: Diagnosis not present

## 2022-10-29 DIAGNOSIS — K2950 Unspecified chronic gastritis without bleeding: Secondary | ICD-10-CM | POA: Diagnosis not present

## 2022-10-29 DIAGNOSIS — K259 Gastric ulcer, unspecified as acute or chronic, without hemorrhage or perforation: Secondary | ICD-10-CM | POA: Diagnosis not present

## 2022-10-29 DIAGNOSIS — J441 Chronic obstructive pulmonary disease with (acute) exacerbation: Secondary | ICD-10-CM | POA: Diagnosis not present

## 2022-10-29 DIAGNOSIS — E875 Hyperkalemia: Secondary | ICD-10-CM | POA: Diagnosis not present

## 2022-10-29 DIAGNOSIS — R571 Hypovolemic shock: Secondary | ICD-10-CM | POA: Diagnosis not present

## 2022-10-29 DIAGNOSIS — Z87891 Personal history of nicotine dependence: Secondary | ICD-10-CM | POA: Diagnosis not present

## 2022-10-29 DIAGNOSIS — N179 Acute kidney failure, unspecified: Secondary | ICD-10-CM | POA: Diagnosis not present

## 2022-11-05 DIAGNOSIS — J9622 Acute and chronic respiratory failure with hypercapnia: Secondary | ICD-10-CM | POA: Diagnosis not present

## 2022-11-05 DIAGNOSIS — K2091 Esophagitis, unspecified with bleeding: Secondary | ICD-10-CM | POA: Diagnosis not present

## 2022-11-05 DIAGNOSIS — R1310 Dysphagia, unspecified: Secondary | ICD-10-CM | POA: Diagnosis not present

## 2022-11-05 DIAGNOSIS — I5033 Acute on chronic diastolic (congestive) heart failure: Secondary | ICD-10-CM | POA: Diagnosis not present

## 2022-11-05 DIAGNOSIS — Z87891 Personal history of nicotine dependence: Secondary | ICD-10-CM | POA: Diagnosis not present

## 2022-11-05 DIAGNOSIS — J441 Chronic obstructive pulmonary disease with (acute) exacerbation: Secondary | ICD-10-CM | POA: Diagnosis not present

## 2022-11-05 DIAGNOSIS — I110 Hypertensive heart disease with heart failure: Secondary | ICD-10-CM | POA: Diagnosis not present

## 2022-11-05 DIAGNOSIS — K254 Chronic or unspecified gastric ulcer with hemorrhage: Secondary | ICD-10-CM | POA: Diagnosis not present

## 2022-11-05 DIAGNOSIS — Z9181 History of falling: Secondary | ICD-10-CM | POA: Diagnosis not present

## 2022-11-05 DIAGNOSIS — J9621 Acute and chronic respiratory failure with hypoxia: Secondary | ICD-10-CM | POA: Diagnosis not present

## 2022-11-05 DIAGNOSIS — Z7951 Long term (current) use of inhaled steroids: Secondary | ICD-10-CM | POA: Diagnosis not present

## 2022-11-05 DIAGNOSIS — Z87440 Personal history of urinary (tract) infections: Secondary | ICD-10-CM | POA: Diagnosis not present

## 2022-11-05 DIAGNOSIS — R911 Solitary pulmonary nodule: Secondary | ICD-10-CM | POA: Diagnosis not present

## 2022-11-05 DIAGNOSIS — Z9981 Dependence on supplemental oxygen: Secondary | ICD-10-CM | POA: Diagnosis not present

## 2022-11-05 DIAGNOSIS — D649 Anemia, unspecified: Secondary | ICD-10-CM | POA: Diagnosis not present

## 2022-11-06 DIAGNOSIS — J9621 Acute and chronic respiratory failure with hypoxia: Secondary | ICD-10-CM | POA: Diagnosis not present

## 2022-11-06 DIAGNOSIS — I5033 Acute on chronic diastolic (congestive) heart failure: Secondary | ICD-10-CM | POA: Diagnosis not present

## 2022-11-06 DIAGNOSIS — Z87440 Personal history of urinary (tract) infections: Secondary | ICD-10-CM | POA: Diagnosis not present

## 2022-11-06 DIAGNOSIS — Z9981 Dependence on supplemental oxygen: Secondary | ICD-10-CM | POA: Diagnosis not present

## 2022-11-06 DIAGNOSIS — J441 Chronic obstructive pulmonary disease with (acute) exacerbation: Secondary | ICD-10-CM | POA: Diagnosis not present

## 2022-11-06 DIAGNOSIS — D649 Anemia, unspecified: Secondary | ICD-10-CM | POA: Diagnosis not present

## 2022-11-06 DIAGNOSIS — Z9181 History of falling: Secondary | ICD-10-CM | POA: Diagnosis not present

## 2022-11-06 DIAGNOSIS — K254 Chronic or unspecified gastric ulcer with hemorrhage: Secondary | ICD-10-CM | POA: Diagnosis not present

## 2022-11-06 DIAGNOSIS — R1310 Dysphagia, unspecified: Secondary | ICD-10-CM | POA: Diagnosis not present

## 2022-11-06 DIAGNOSIS — I110 Hypertensive heart disease with heart failure: Secondary | ICD-10-CM | POA: Diagnosis not present

## 2022-11-06 DIAGNOSIS — J9622 Acute and chronic respiratory failure with hypercapnia: Secondary | ICD-10-CM | POA: Diagnosis not present

## 2022-11-06 DIAGNOSIS — Z7951 Long term (current) use of inhaled steroids: Secondary | ICD-10-CM | POA: Diagnosis not present

## 2022-11-06 DIAGNOSIS — R911 Solitary pulmonary nodule: Secondary | ICD-10-CM | POA: Diagnosis not present

## 2022-11-06 DIAGNOSIS — K2091 Esophagitis, unspecified with bleeding: Secondary | ICD-10-CM | POA: Diagnosis not present

## 2022-11-06 DIAGNOSIS — Z87891 Personal history of nicotine dependence: Secondary | ICD-10-CM | POA: Diagnosis not present

## 2022-11-10 DIAGNOSIS — J449 Chronic obstructive pulmonary disease, unspecified: Secondary | ICD-10-CM | POA: Diagnosis not present

## 2022-11-11 DIAGNOSIS — J449 Chronic obstructive pulmonary disease, unspecified: Secondary | ICD-10-CM | POA: Diagnosis not present

## 2022-11-11 DIAGNOSIS — G4733 Obstructive sleep apnea (adult) (pediatric): Secondary | ICD-10-CM | POA: Diagnosis not present

## 2022-11-11 DIAGNOSIS — I5032 Chronic diastolic (congestive) heart failure: Secondary | ICD-10-CM | POA: Diagnosis not present

## 2022-11-11 DIAGNOSIS — J9611 Chronic respiratory failure with hypoxia: Secondary | ICD-10-CM | POA: Diagnosis not present

## 2022-11-11 DIAGNOSIS — Z9981 Dependence on supplemental oxygen: Secondary | ICD-10-CM | POA: Diagnosis not present

## 2022-11-12 DIAGNOSIS — J449 Chronic obstructive pulmonary disease, unspecified: Secondary | ICD-10-CM | POA: Diagnosis not present

## 2022-11-13 DIAGNOSIS — Z7951 Long term (current) use of inhaled steroids: Secondary | ICD-10-CM | POA: Diagnosis not present

## 2022-11-13 DIAGNOSIS — R911 Solitary pulmonary nodule: Secondary | ICD-10-CM | POA: Diagnosis not present

## 2022-11-13 DIAGNOSIS — I110 Hypertensive heart disease with heart failure: Secondary | ICD-10-CM | POA: Diagnosis not present

## 2022-11-13 DIAGNOSIS — Z87440 Personal history of urinary (tract) infections: Secondary | ICD-10-CM | POA: Diagnosis not present

## 2022-11-13 DIAGNOSIS — K254 Chronic or unspecified gastric ulcer with hemorrhage: Secondary | ICD-10-CM | POA: Diagnosis not present

## 2022-11-13 DIAGNOSIS — J441 Chronic obstructive pulmonary disease with (acute) exacerbation: Secondary | ICD-10-CM | POA: Diagnosis not present

## 2022-11-13 DIAGNOSIS — Z9981 Dependence on supplemental oxygen: Secondary | ICD-10-CM | POA: Diagnosis not present

## 2022-11-13 DIAGNOSIS — Z9181 History of falling: Secondary | ICD-10-CM | POA: Diagnosis not present

## 2022-11-13 DIAGNOSIS — Z87891 Personal history of nicotine dependence: Secondary | ICD-10-CM | POA: Diagnosis not present

## 2022-11-13 DIAGNOSIS — I5033 Acute on chronic diastolic (congestive) heart failure: Secondary | ICD-10-CM | POA: Diagnosis not present

## 2022-11-13 DIAGNOSIS — J9621 Acute and chronic respiratory failure with hypoxia: Secondary | ICD-10-CM | POA: Diagnosis not present

## 2022-11-13 DIAGNOSIS — R1310 Dysphagia, unspecified: Secondary | ICD-10-CM | POA: Diagnosis not present

## 2022-11-13 DIAGNOSIS — K2091 Esophagitis, unspecified with bleeding: Secondary | ICD-10-CM | POA: Diagnosis not present

## 2022-11-13 DIAGNOSIS — D649 Anemia, unspecified: Secondary | ICD-10-CM | POA: Diagnosis not present

## 2022-11-13 DIAGNOSIS — J9622 Acute and chronic respiratory failure with hypercapnia: Secondary | ICD-10-CM | POA: Diagnosis not present

## 2022-11-14 DIAGNOSIS — Z9981 Dependence on supplemental oxygen: Secondary | ICD-10-CM | POA: Diagnosis not present

## 2022-11-14 DIAGNOSIS — R1310 Dysphagia, unspecified: Secondary | ICD-10-CM | POA: Diagnosis not present

## 2022-11-14 DIAGNOSIS — D649 Anemia, unspecified: Secondary | ICD-10-CM | POA: Diagnosis not present

## 2022-11-14 DIAGNOSIS — K2091 Esophagitis, unspecified with bleeding: Secondary | ICD-10-CM | POA: Diagnosis not present

## 2022-11-14 DIAGNOSIS — J9621 Acute and chronic respiratory failure with hypoxia: Secondary | ICD-10-CM | POA: Diagnosis not present

## 2022-11-14 DIAGNOSIS — I110 Hypertensive heart disease with heart failure: Secondary | ICD-10-CM | POA: Diagnosis not present

## 2022-11-14 DIAGNOSIS — Z7951 Long term (current) use of inhaled steroids: Secondary | ICD-10-CM | POA: Diagnosis not present

## 2022-11-14 DIAGNOSIS — J441 Chronic obstructive pulmonary disease with (acute) exacerbation: Secondary | ICD-10-CM | POA: Diagnosis not present

## 2022-11-14 DIAGNOSIS — K254 Chronic or unspecified gastric ulcer with hemorrhage: Secondary | ICD-10-CM | POA: Diagnosis not present

## 2022-11-14 DIAGNOSIS — Z87891 Personal history of nicotine dependence: Secondary | ICD-10-CM | POA: Diagnosis not present

## 2022-11-14 DIAGNOSIS — R911 Solitary pulmonary nodule: Secondary | ICD-10-CM | POA: Diagnosis not present

## 2022-11-14 DIAGNOSIS — Z87440 Personal history of urinary (tract) infections: Secondary | ICD-10-CM | POA: Diagnosis not present

## 2022-11-14 DIAGNOSIS — J9622 Acute and chronic respiratory failure with hypercapnia: Secondary | ICD-10-CM | POA: Diagnosis not present

## 2022-11-14 DIAGNOSIS — I5033 Acute on chronic diastolic (congestive) heart failure: Secondary | ICD-10-CM | POA: Diagnosis not present

## 2022-11-14 DIAGNOSIS — Z9181 History of falling: Secondary | ICD-10-CM | POA: Diagnosis not present

## 2022-11-15 DIAGNOSIS — K254 Chronic or unspecified gastric ulcer with hemorrhage: Secondary | ICD-10-CM | POA: Diagnosis not present

## 2022-11-15 DIAGNOSIS — R1310 Dysphagia, unspecified: Secondary | ICD-10-CM | POA: Diagnosis not present

## 2022-11-15 DIAGNOSIS — Z87891 Personal history of nicotine dependence: Secondary | ICD-10-CM | POA: Diagnosis not present

## 2022-11-15 DIAGNOSIS — J441 Chronic obstructive pulmonary disease with (acute) exacerbation: Secondary | ICD-10-CM | POA: Diagnosis not present

## 2022-11-15 DIAGNOSIS — Z7951 Long term (current) use of inhaled steroids: Secondary | ICD-10-CM | POA: Diagnosis not present

## 2022-11-15 DIAGNOSIS — I5033 Acute on chronic diastolic (congestive) heart failure: Secondary | ICD-10-CM | POA: Diagnosis not present

## 2022-11-15 DIAGNOSIS — J9622 Acute and chronic respiratory failure with hypercapnia: Secondary | ICD-10-CM | POA: Diagnosis not present

## 2022-11-15 DIAGNOSIS — D649 Anemia, unspecified: Secondary | ICD-10-CM | POA: Diagnosis not present

## 2022-11-15 DIAGNOSIS — I110 Hypertensive heart disease with heart failure: Secondary | ICD-10-CM | POA: Diagnosis not present

## 2022-11-15 DIAGNOSIS — J9621 Acute and chronic respiratory failure with hypoxia: Secondary | ICD-10-CM | POA: Diagnosis not present

## 2022-11-15 DIAGNOSIS — Z9981 Dependence on supplemental oxygen: Secondary | ICD-10-CM | POA: Diagnosis not present

## 2022-11-15 DIAGNOSIS — K2091 Esophagitis, unspecified with bleeding: Secondary | ICD-10-CM | POA: Diagnosis not present

## 2022-11-15 DIAGNOSIS — R911 Solitary pulmonary nodule: Secondary | ICD-10-CM | POA: Diagnosis not present

## 2022-11-15 DIAGNOSIS — Z9181 History of falling: Secondary | ICD-10-CM | POA: Diagnosis not present

## 2022-11-15 DIAGNOSIS — H401112 Primary open-angle glaucoma, right eye, moderate stage: Secondary | ICD-10-CM | POA: Diagnosis not present

## 2022-11-15 DIAGNOSIS — Z87440 Personal history of urinary (tract) infections: Secondary | ICD-10-CM | POA: Diagnosis not present

## 2022-11-19 DIAGNOSIS — K254 Chronic or unspecified gastric ulcer with hemorrhage: Secondary | ICD-10-CM | POA: Diagnosis not present

## 2022-11-19 DIAGNOSIS — Z7951 Long term (current) use of inhaled steroids: Secondary | ICD-10-CM | POA: Diagnosis not present

## 2022-11-19 DIAGNOSIS — J9621 Acute and chronic respiratory failure with hypoxia: Secondary | ICD-10-CM | POA: Diagnosis not present

## 2022-11-19 DIAGNOSIS — R1310 Dysphagia, unspecified: Secondary | ICD-10-CM | POA: Diagnosis not present

## 2022-11-19 DIAGNOSIS — J9622 Acute and chronic respiratory failure with hypercapnia: Secondary | ICD-10-CM | POA: Diagnosis not present

## 2022-11-19 DIAGNOSIS — I110 Hypertensive heart disease with heart failure: Secondary | ICD-10-CM | POA: Diagnosis not present

## 2022-11-19 DIAGNOSIS — I5033 Acute on chronic diastolic (congestive) heart failure: Secondary | ICD-10-CM | POA: Diagnosis not present

## 2022-11-19 DIAGNOSIS — D649 Anemia, unspecified: Secondary | ICD-10-CM | POA: Diagnosis not present

## 2022-11-19 DIAGNOSIS — Z87440 Personal history of urinary (tract) infections: Secondary | ICD-10-CM | POA: Diagnosis not present

## 2022-11-19 DIAGNOSIS — Z87891 Personal history of nicotine dependence: Secondary | ICD-10-CM | POA: Diagnosis not present

## 2022-11-19 DIAGNOSIS — R911 Solitary pulmonary nodule: Secondary | ICD-10-CM | POA: Diagnosis not present

## 2022-11-19 DIAGNOSIS — Z9181 History of falling: Secondary | ICD-10-CM | POA: Diagnosis not present

## 2022-11-19 DIAGNOSIS — J441 Chronic obstructive pulmonary disease with (acute) exacerbation: Secondary | ICD-10-CM | POA: Diagnosis not present

## 2022-11-19 DIAGNOSIS — K2091 Esophagitis, unspecified with bleeding: Secondary | ICD-10-CM | POA: Diagnosis not present

## 2022-11-19 DIAGNOSIS — Z9981 Dependence on supplemental oxygen: Secondary | ICD-10-CM | POA: Diagnosis not present

## 2022-11-20 DIAGNOSIS — Z9181 History of falling: Secondary | ICD-10-CM | POA: Diagnosis not present

## 2022-11-20 DIAGNOSIS — I5033 Acute on chronic diastolic (congestive) heart failure: Secondary | ICD-10-CM | POA: Diagnosis not present

## 2022-11-20 DIAGNOSIS — Z87440 Personal history of urinary (tract) infections: Secondary | ICD-10-CM | POA: Diagnosis not present

## 2022-11-20 DIAGNOSIS — Z9981 Dependence on supplemental oxygen: Secondary | ICD-10-CM | POA: Diagnosis not present

## 2022-11-20 DIAGNOSIS — K2091 Esophagitis, unspecified with bleeding: Secondary | ICD-10-CM | POA: Diagnosis not present

## 2022-11-20 DIAGNOSIS — R1310 Dysphagia, unspecified: Secondary | ICD-10-CM | POA: Diagnosis not present

## 2022-11-20 DIAGNOSIS — D649 Anemia, unspecified: Secondary | ICD-10-CM | POA: Diagnosis not present

## 2022-11-20 DIAGNOSIS — J9621 Acute and chronic respiratory failure with hypoxia: Secondary | ICD-10-CM | POA: Diagnosis not present

## 2022-11-20 DIAGNOSIS — I110 Hypertensive heart disease with heart failure: Secondary | ICD-10-CM | POA: Diagnosis not present

## 2022-11-20 DIAGNOSIS — Z7951 Long term (current) use of inhaled steroids: Secondary | ICD-10-CM | POA: Diagnosis not present

## 2022-11-20 DIAGNOSIS — J9622 Acute and chronic respiratory failure with hypercapnia: Secondary | ICD-10-CM | POA: Diagnosis not present

## 2022-11-20 DIAGNOSIS — K254 Chronic or unspecified gastric ulcer with hemorrhage: Secondary | ICD-10-CM | POA: Diagnosis not present

## 2022-11-20 DIAGNOSIS — Z87891 Personal history of nicotine dependence: Secondary | ICD-10-CM | POA: Diagnosis not present

## 2022-11-20 DIAGNOSIS — R911 Solitary pulmonary nodule: Secondary | ICD-10-CM | POA: Diagnosis not present

## 2022-11-20 DIAGNOSIS — J441 Chronic obstructive pulmonary disease with (acute) exacerbation: Secondary | ICD-10-CM | POA: Diagnosis not present

## 2022-11-21 DIAGNOSIS — D509 Iron deficiency anemia, unspecified: Secondary | ICD-10-CM | POA: Diagnosis not present

## 2022-11-21 DIAGNOSIS — R0902 Hypoxemia: Secondary | ICD-10-CM | POA: Diagnosis not present

## 2022-11-21 DIAGNOSIS — R609 Edema, unspecified: Secondary | ICD-10-CM | POA: Diagnosis not present

## 2022-11-21 DIAGNOSIS — J449 Chronic obstructive pulmonary disease, unspecified: Secondary | ICD-10-CM | POA: Diagnosis not present

## 2022-11-23 DIAGNOSIS — R0602 Shortness of breath: Secondary | ICD-10-CM | POA: Diagnosis not present

## 2022-11-23 DIAGNOSIS — Z20822 Contact with and (suspected) exposure to covid-19: Secondary | ICD-10-CM | POA: Diagnosis not present

## 2022-11-23 DIAGNOSIS — Z91199 Patient's noncompliance with other medical treatment and regimen due to unspecified reason: Secondary | ICD-10-CM | POA: Diagnosis not present

## 2022-11-23 DIAGNOSIS — E8729 Other acidosis: Secondary | ICD-10-CM | POA: Diagnosis not present

## 2022-11-23 DIAGNOSIS — Z7982 Long term (current) use of aspirin: Secondary | ICD-10-CM | POA: Diagnosis not present

## 2022-11-23 DIAGNOSIS — R739 Hyperglycemia, unspecified: Secondary | ICD-10-CM | POA: Diagnosis not present

## 2022-11-23 DIAGNOSIS — K5900 Constipation, unspecified: Secondary | ICD-10-CM | POA: Diagnosis not present

## 2022-11-23 DIAGNOSIS — J9622 Acute and chronic respiratory failure with hypercapnia: Secondary | ICD-10-CM | POA: Diagnosis not present

## 2022-11-23 DIAGNOSIS — J9602 Acute respiratory failure with hypercapnia: Secondary | ICD-10-CM | POA: Diagnosis not present

## 2022-11-23 DIAGNOSIS — Z79899 Other long term (current) drug therapy: Secondary | ICD-10-CM | POA: Diagnosis not present

## 2022-11-23 DIAGNOSIS — I4510 Unspecified right bundle-branch block: Secondary | ICD-10-CM | POA: Diagnosis not present

## 2022-11-23 DIAGNOSIS — J449 Chronic obstructive pulmonary disease, unspecified: Secondary | ICD-10-CM | POA: Diagnosis not present

## 2022-11-23 DIAGNOSIS — Z515 Encounter for palliative care: Secondary | ICD-10-CM | POA: Diagnosis not present

## 2022-11-23 DIAGNOSIS — J42 Unspecified chronic bronchitis: Secondary | ICD-10-CM | POA: Diagnosis not present

## 2022-11-23 DIAGNOSIS — R7989 Other specified abnormal findings of blood chemistry: Secondary | ICD-10-CM | POA: Diagnosis not present

## 2022-11-23 DIAGNOSIS — J9611 Chronic respiratory failure with hypoxia: Secondary | ICD-10-CM | POA: Diagnosis not present

## 2022-11-23 DIAGNOSIS — Z743 Need for continuous supervision: Secondary | ICD-10-CM | POA: Diagnosis not present

## 2022-11-23 DIAGNOSIS — R6889 Other general symptoms and signs: Secondary | ICD-10-CM | POA: Diagnosis not present

## 2022-11-23 DIAGNOSIS — R5381 Other malaise: Secondary | ICD-10-CM | POA: Diagnosis not present

## 2022-11-23 DIAGNOSIS — I5032 Chronic diastolic (congestive) heart failure: Secondary | ICD-10-CM | POA: Diagnosis not present

## 2022-11-23 DIAGNOSIS — I499 Cardiac arrhythmia, unspecified: Secondary | ICD-10-CM | POA: Diagnosis not present

## 2022-11-23 DIAGNOSIS — Z66 Do not resuscitate: Secondary | ICD-10-CM | POA: Diagnosis not present

## 2022-11-23 DIAGNOSIS — R627 Adult failure to thrive: Secondary | ICD-10-CM | POA: Diagnosis not present

## 2022-11-23 DIAGNOSIS — Z87891 Personal history of nicotine dependence: Secondary | ICD-10-CM | POA: Diagnosis not present

## 2022-11-23 DIAGNOSIS — Z9981 Dependence on supplemental oxygen: Secondary | ICD-10-CM | POA: Diagnosis not present

## 2022-11-23 DIAGNOSIS — J9621 Acute and chronic respiratory failure with hypoxia: Secondary | ICD-10-CM | POA: Diagnosis not present

## 2022-11-23 DIAGNOSIS — R0902 Hypoxemia: Secondary | ICD-10-CM | POA: Diagnosis not present

## 2022-11-23 DIAGNOSIS — D649 Anemia, unspecified: Secondary | ICD-10-CM | POA: Diagnosis not present

## 2022-11-23 DIAGNOSIS — E785 Hyperlipidemia, unspecified: Secondary | ICD-10-CM | POA: Diagnosis not present

## 2022-11-23 DIAGNOSIS — J440 Chronic obstructive pulmonary disease with acute lower respiratory infection: Secondary | ICD-10-CM | POA: Diagnosis not present

## 2022-11-23 DIAGNOSIS — I110 Hypertensive heart disease with heart failure: Secondary | ICD-10-CM | POA: Diagnosis not present

## 2022-11-23 DIAGNOSIS — I10 Essential (primary) hypertension: Secondary | ICD-10-CM | POA: Diagnosis not present

## 2022-11-23 DIAGNOSIS — J9601 Acute respiratory failure with hypoxia: Secondary | ICD-10-CM | POA: Diagnosis not present

## 2022-11-24 DIAGNOSIS — E785 Hyperlipidemia, unspecified: Secondary | ICD-10-CM | POA: Diagnosis not present

## 2022-11-24 DIAGNOSIS — J9622 Acute and chronic respiratory failure with hypercapnia: Secondary | ICD-10-CM | POA: Diagnosis not present

## 2022-11-24 DIAGNOSIS — K5900 Constipation, unspecified: Secondary | ICD-10-CM | POA: Diagnosis not present

## 2022-11-24 DIAGNOSIS — J449 Chronic obstructive pulmonary disease, unspecified: Secondary | ICD-10-CM | POA: Diagnosis not present

## 2022-11-24 DIAGNOSIS — R627 Adult failure to thrive: Secondary | ICD-10-CM | POA: Diagnosis not present

## 2022-11-24 DIAGNOSIS — I5032 Chronic diastolic (congestive) heart failure: Secondary | ICD-10-CM | POA: Diagnosis not present

## 2022-11-24 DIAGNOSIS — J9621 Acute and chronic respiratory failure with hypoxia: Secondary | ICD-10-CM | POA: Diagnosis not present

## 2022-11-24 DIAGNOSIS — R5381 Other malaise: Secondary | ICD-10-CM | POA: Diagnosis not present

## 2022-11-24 DIAGNOSIS — I110 Hypertensive heart disease with heart failure: Secondary | ICD-10-CM | POA: Diagnosis not present

## 2022-11-24 DIAGNOSIS — Z9981 Dependence on supplemental oxygen: Secondary | ICD-10-CM | POA: Diagnosis not present

## 2022-11-25 DIAGNOSIS — Z515 Encounter for palliative care: Secondary | ICD-10-CM | POA: Diagnosis not present

## 2022-11-25 DIAGNOSIS — I110 Hypertensive heart disease with heart failure: Secondary | ICD-10-CM | POA: Diagnosis not present

## 2022-11-25 DIAGNOSIS — J42 Unspecified chronic bronchitis: Secondary | ICD-10-CM | POA: Diagnosis not present

## 2022-11-25 DIAGNOSIS — J9622 Acute and chronic respiratory failure with hypercapnia: Secondary | ICD-10-CM | POA: Diagnosis not present

## 2022-11-25 DIAGNOSIS — R627 Adult failure to thrive: Secondary | ICD-10-CM | POA: Diagnosis not present

## 2022-11-25 DIAGNOSIS — I5032 Chronic diastolic (congestive) heart failure: Secondary | ICD-10-CM | POA: Diagnosis not present

## 2022-11-25 DIAGNOSIS — Z66 Do not resuscitate: Secondary | ICD-10-CM | POA: Diagnosis not present

## 2022-11-25 DIAGNOSIS — J449 Chronic obstructive pulmonary disease, unspecified: Secondary | ICD-10-CM | POA: Diagnosis not present

## 2022-11-25 DIAGNOSIS — R5381 Other malaise: Secondary | ICD-10-CM | POA: Diagnosis not present

## 2022-11-25 DIAGNOSIS — J9621 Acute and chronic respiratory failure with hypoxia: Secondary | ICD-10-CM | POA: Diagnosis not present

## 2022-11-25 DIAGNOSIS — E785 Hyperlipidemia, unspecified: Secondary | ICD-10-CM | POA: Diagnosis not present

## 2022-11-25 DIAGNOSIS — Z9981 Dependence on supplemental oxygen: Secondary | ICD-10-CM | POA: Diagnosis not present

## 2022-11-26 DIAGNOSIS — I5032 Chronic diastolic (congestive) heart failure: Secondary | ICD-10-CM | POA: Diagnosis not present

## 2022-11-26 DIAGNOSIS — J9622 Acute and chronic respiratory failure with hypercapnia: Secondary | ICD-10-CM | POA: Diagnosis not present

## 2022-11-26 DIAGNOSIS — R627 Adult failure to thrive: Secondary | ICD-10-CM | POA: Diagnosis not present

## 2022-11-26 DIAGNOSIS — I10 Essential (primary) hypertension: Secondary | ICD-10-CM | POA: Diagnosis not present

## 2022-11-26 DIAGNOSIS — J9611 Chronic respiratory failure with hypoxia: Secondary | ICD-10-CM | POA: Diagnosis not present

## 2022-11-26 DIAGNOSIS — R5381 Other malaise: Secondary | ICD-10-CM | POA: Diagnosis not present

## 2022-11-26 DIAGNOSIS — Z9981 Dependence on supplemental oxygen: Secondary | ICD-10-CM | POA: Diagnosis not present

## 2022-11-26 DIAGNOSIS — E785 Hyperlipidemia, unspecified: Secondary | ICD-10-CM | POA: Diagnosis not present

## 2022-11-26 DIAGNOSIS — J9621 Acute and chronic respiratory failure with hypoxia: Secondary | ICD-10-CM | POA: Diagnosis not present

## 2022-11-26 DIAGNOSIS — I110 Hypertensive heart disease with heart failure: Secondary | ICD-10-CM | POA: Diagnosis not present

## 2022-11-26 DIAGNOSIS — J449 Chronic obstructive pulmonary disease, unspecified: Secondary | ICD-10-CM | POA: Diagnosis not present

## 2022-11-26 DIAGNOSIS — D649 Anemia, unspecified: Secondary | ICD-10-CM | POA: Diagnosis not present

## 2022-11-29 DEATH — deceased
